# Patient Record
Sex: Female | Born: 1985 | Marital: Married | Smoking: Never smoker
Health system: Southern US, Community
[De-identification: ages and names within clinical notes are randomized; demographics above are authoritative.]

## PROBLEM LIST (undated history)

## (undated) DIAGNOSIS — O09299 Supervision of pregnancy with other poor reproductive or obstetric history, unspecified trimester: Secondary | ICD-10-CM

## (undated) DIAGNOSIS — T8859XA Other complications of anesthesia, initial encounter: Secondary | ICD-10-CM

## (undated) HISTORY — PX: FOOT SURGERY: SHX648

---

## 2021-09-18 LAB — OB RESULTS CONSOLE RUBELLA ANTIBODY, IGM: Rubella: IMMUNE

## 2021-09-18 LAB — OB RESULTS CONSOLE HIV ANTIBODY (ROUTINE TESTING): HIV: NONREACTIVE

## 2021-09-18 LAB — OB RESULTS CONSOLE RPR: RPR: NONREACTIVE

## 2021-09-18 LAB — OB RESULTS CONSOLE ABO/RH: RH Type: POSITIVE

## 2021-09-18 LAB — OB RESULTS CONSOLE GC/CHLAMYDIA
Chlamydia: NEGATIVE
Neisseria Gonorrhea: NEGATIVE

## 2021-09-18 LAB — OB RESULTS CONSOLE ANTIBODY SCREEN: Antibody Screen: NEGATIVE

## 2021-09-18 LAB — OB RESULTS CONSOLE HEPATITIS B SURFACE ANTIGEN: Hepatitis B Surface Ag: NEGATIVE

## 2021-09-19 ENCOUNTER — Inpatient Hospital Stay (HOSPITAL_COMMUNITY): Admission: AD | Admit: 2021-09-19 | Payer: 59 | Source: Home / Self Care | Admitting: Obstetrics and Gynecology

## 2022-02-11 LAB — OB RESULTS CONSOLE GBS: GBS: POSITIVE

## 2022-02-18 ENCOUNTER — Encounter (HOSPITAL_COMMUNITY): Payer: Self-pay | Admitting: *Deleted

## 2022-02-18 ENCOUNTER — Telehealth (HOSPITAL_COMMUNITY): Payer: Self-pay | Admitting: *Deleted

## 2022-02-18 NOTE — Telephone Encounter (Signed)
Preadmission screen  

## 2022-02-18 NOTE — Patient Instructions (Signed)
Mary Mccall  02/18/2022   Your procedure is scheduled on:  03/04/2022  Arrive at 1200 at Entrance C on [employer] at [facility]  and [employer]. You are invited to use the FREE valet parking or use the Visitor's parking deck.  Pick up the phone at the desk and dial [phone].  Call this number if you have problems the morning of surgery: [phone]  Remember:   Do not eat food:(After Midnight) Desps de medianoche.  Do not drink clear liquids: (After Midnight) Desps de medianoche.  Take these medicines the morning of surgery with A SIP OF WATER:  none   Do not wear jewelry, make-up or nail polish.  Do not wear lotions, powders, or perfumes. Do not wear deodorant.  Do not shave 48 hours prior to surgery.  Do not bring valuables to the hospital.  [facility] is not   responsible for any belongings or valuables brought to the hospital.  Contacts, dentures or bridgework may not be worn into surgery.  Leave suitcase in the car. After surgery it may be brought to your room.  For patients admitted to the hospital, checkout time is 11:00 AM the day of              discharge.      Please read over the following fact sheets that you were given:     Preparing for Surgery

## 2022-02-20 ENCOUNTER — Other Ambulatory Visit: Payer: Self-pay | Admitting: Obstetrics and Gynecology

## 2022-02-20 ENCOUNTER — Telehealth (HOSPITAL_COMMUNITY): Payer: Self-pay | Admitting: *Deleted

## 2022-02-20 ENCOUNTER — Encounter (HOSPITAL_COMMUNITY): Payer: Self-pay

## 2022-02-20 NOTE — Telephone Encounter (Signed)
Preadmission screen  

## 2022-03-03 ENCOUNTER — Encounter (HOSPITAL_COMMUNITY)
Admission: RE | Admit: 2022-03-03 | Discharge: 2022-03-03 | Disposition: A | Discharge status: Home / Self Care | Payer: 59 | Source: Ambulatory Visit | Attending: Obstetrics and Gynecology | Admitting: Obstetrics and Gynecology

## 2022-03-03 ENCOUNTER — Encounter (HOSPITAL_COMMUNITY): Payer: Self-pay | Admitting: Obstetrics and Gynecology

## 2022-03-03 DIAGNOSIS — Z01812 Encounter for preprocedural laboratory examination: Secondary | ICD-10-CM | POA: Insufficient documentation

## 2022-03-03 LAB — CBC
HCT: 30.8 % — ABNORMAL LOW (ref 36.0–46.0)
Hemoglobin: 10.2 g/dL — ABNORMAL LOW (ref 12.0–15.0)
MCH: 30.4 pg (ref 26.0–34.0)
MCHC: 33.1 g/dL (ref 30.0–36.0)
MCV: 91.7 fL (ref 80.0–100.0)
Platelets: 178 10*3/uL (ref 150–400)
RBC: 3.36 MIL/uL — ABNORMAL LOW (ref 3.87–5.11)
RDW: 13.5 % (ref 11.5–15.5)
WBC: 10 10*3/uL (ref 4.0–10.5)
nRBC: 0 % (ref 0.0–0.2)

## 2022-03-03 LAB — TYPE AND SCREEN
ABO/RH(D): A POS
Antibody Screen: NEGATIVE

## 2022-03-03 LAB — SYPHILIS: RPR W/REFLEX TO RPR TITER AND TREPONEMAL ANTIBODIES, TRADITIONAL SCREENING AND DIAGNOSIS ALGORITHM: RPR Ser Ql: NONREACTIVE

## 2022-03-04 ENCOUNTER — Inpatient Hospital Stay (HOSPITAL_COMMUNITY)
Admission: RE | Admit: 2022-03-04 | Discharge: 2022-03-06 | DRG: 787 | Disposition: A | Discharge status: Home / Self Care | Payer: 59 | Source: Ambulatory Visit | Attending: Obstetrics and Gynecology | Admitting: Obstetrics and Gynecology

## 2022-03-04 ENCOUNTER — Other Ambulatory Visit: Payer: Self-pay

## 2022-03-04 ENCOUNTER — Encounter (HOSPITAL_COMMUNITY): Payer: Self-pay | Admitting: Obstetrics and Gynecology

## 2022-03-04 ENCOUNTER — Inpatient Hospital Stay (HOSPITAL_COMMUNITY): Payer: 59 | Admitting: Anesthesiology

## 2022-03-04 ENCOUNTER — Encounter (HOSPITAL_COMMUNITY)
Admission: RE | Disposition: A | Discharge status: Home / Self Care | Payer: Self-pay | Source: Ambulatory Visit | Attending: Obstetrics and Gynecology

## 2022-03-04 DIAGNOSIS — Z98891 History of uterine scar from previous surgery: Secondary | ICD-10-CM

## 2022-03-04 DIAGNOSIS — O99824 Streptococcus B carrier state complicating childbirth: Secondary | ICD-10-CM | POA: Diagnosis present

## 2022-03-04 DIAGNOSIS — Z3A39 39 weeks gestation of pregnancy: Secondary | ICD-10-CM

## 2022-03-04 DIAGNOSIS — B951 Streptococcus, group B, as the cause of diseases classified elsewhere: Secondary | ICD-10-CM | POA: Diagnosis present

## 2022-03-04 DIAGNOSIS — O9902 Anemia complicating childbirth: Secondary | ICD-10-CM | POA: Diagnosis present

## 2022-03-04 DIAGNOSIS — O321XX0 Maternal care for breech presentation, not applicable or unspecified: Secondary | ICD-10-CM | POA: Diagnosis present

## 2022-03-04 DIAGNOSIS — O9832 Other infections with a predominantly sexual mode of transmission complicating childbirth: Secondary | ICD-10-CM | POA: Diagnosis present

## 2022-03-04 DIAGNOSIS — D509 Iron deficiency anemia, unspecified: Secondary | ICD-10-CM | POA: Diagnosis present

## 2022-03-04 DIAGNOSIS — A6000 Herpesviral infection of urogenital system, unspecified: Secondary | ICD-10-CM | POA: Diagnosis present

## 2022-03-04 DIAGNOSIS — O328XX0 Maternal care for other malpresentation of fetus, not applicable or unspecified: Secondary | ICD-10-CM | POA: Diagnosis present

## 2022-03-04 LAB — ABO/RH: ABO/RH(D): A POS

## 2022-03-04 SURGERY — Surgical Case
Anesthesia: Spinal

## 2022-03-04 MED ORDER — SENNOSIDES-DOCUSATE SODIUM 8.6-50 MG PO TABS
2.0000 | ORAL_TABLET | Freq: Every day | ORAL | Status: DC
  Administered 2022-03-05: 2 via ORAL
  Filled 2022-03-04 (×2): qty 2

## 2022-03-04 MED ORDER — HYDROMORPHONE HCL 1 MG/ML IJ SOLN: 0.2000 mg | INTRAMUSCULAR | Status: DC | PRN

## 2022-03-04 MED ORDER — SIMETHICONE 80 MG PO CHEW: 80.0000 mg | CHEWABLE_TABLET | ORAL | Status: DC | PRN

## 2022-03-04 MED ORDER — PHENYLEPHRINE HCL-NACL 20-0.9 MG/250ML-% IV SOLN
INTRAVENOUS | Status: DC | PRN
  Administered 2022-03-04: 60 ug/min via INTRAVENOUS

## 2022-03-04 MED ORDER — DIBUCAINE (PERIANAL) 1 % EX OINT
1.0000 | TOPICAL_OINTMENT | CUTANEOUS | Status: DC | PRN
Start: 2022-03-04 — End: 2022-03-06

## 2022-03-04 MED ORDER — METOCLOPRAMIDE HCL 5 MG/ML IJ SOLN
INTRAMUSCULAR | Status: AC
  Filled 2022-03-04: qty 2

## 2022-03-04 MED ORDER — OXYTOCIN-SODIUM CHLORIDE 30-0.9 UT/500ML-% IV SOLN
INTRAVENOUS | Status: DC | PRN
  Administered 2022-03-04: 400 mL via INTRAVENOUS

## 2022-03-04 MED ORDER — LACTATED RINGERS IV SOLN: INTRAVENOUS | Status: DC

## 2022-03-04 MED ORDER — KETOROLAC TROMETHAMINE 30 MG/ML IJ SOLN
30.0000 mg | Freq: Four times a day (QID) | INTRAMUSCULAR | Status: AC
  Administered 2022-03-04 – 2022-03-05 (×3): 30 mg via INTRAVENOUS
  Filled 2022-03-04 (×3): qty 1

## 2022-03-04 MED ORDER — MENTHOL 3 MG MT LOZG: 1.0000 | LOZENGE | OROMUCOSAL | Status: DC | PRN

## 2022-03-04 MED ORDER — FENTANYL CITRATE (PF) 100 MCG/2ML IJ SOLN
INTRAMUSCULAR | Status: AC
  Filled 2022-03-04: qty 2

## 2022-03-04 MED ORDER — OXYCODONE HCL 5 MG PO TABS: 5.0000 mg | ORAL_TABLET | ORAL | Status: DC | PRN

## 2022-03-04 MED ORDER — ONDANSETRON HCL 4 MG/2ML IJ SOLN
INTRAMUSCULAR | Status: AC
  Filled 2022-03-04: qty 2

## 2022-03-04 MED ORDER — PHENYLEPHRINE HCL-NACL 20-0.9 MG/250ML-% IV SOLN
INTRAVENOUS | Status: AC
  Filled 2022-03-04: qty 250

## 2022-03-04 MED ORDER — POVIDONE-IODINE 10 % EX SWAB
2.0000 "application " | Freq: Once | CUTANEOUS | Status: AC
  Administered 2022-03-04: 2 via TOPICAL

## 2022-03-04 MED ORDER — MORPHINE SULFATE (PF) 0.5 MG/ML IJ SOLN
INTRAMUSCULAR | Status: DC | PRN
  Administered 2022-03-04: 150 ug via INTRATHECAL

## 2022-03-04 MED ORDER — ZOLPIDEM TARTRATE 5 MG PO TABS: 5.0000 mg | ORAL_TABLET | Freq: Every evening | ORAL | Status: DC | PRN

## 2022-03-04 MED ORDER — FENTANYL CITRATE (PF) 100 MCG/2ML IJ SOLN
INTRAMUSCULAR | Status: DC | PRN
Start: 2022-03-04 — End: 2022-03-04
  Administered 2022-03-04: 15 ug via INTRATHECAL

## 2022-03-04 MED ORDER — ACETAMINOPHEN 500 MG PO TABS
1000.0000 mg | ORAL_TABLET | Freq: Four times a day (QID) | ORAL | Status: DC
  Administered 2022-03-04 – 2022-03-06 (×7): 1000 mg via ORAL
  Filled 2022-03-04 (×7): qty 2

## 2022-03-04 MED ORDER — WITCH HAZEL-GLYCERIN EX PADS: 1.0000 "application " | MEDICATED_PAD | CUTANEOUS | Status: DC | PRN

## 2022-03-04 MED ORDER — MORPHINE SULFATE (PF) 0.5 MG/ML IJ SOLN
INTRAMUSCULAR | Status: AC
  Filled 2022-03-04: qty 10

## 2022-03-04 MED ORDER — OXYTOCIN-SODIUM CHLORIDE 30-0.9 UT/500ML-% IV SOLN
2.5000 [IU]/h | INTRAVENOUS | Status: AC
  Administered 2022-03-04: 2.5 [IU]/h via INTRAVENOUS
  Filled 2022-03-04: qty 500

## 2022-03-04 MED ORDER — COCONUT OIL OIL: 1.0000 "application " | TOPICAL_OIL | Status: DC | PRN

## 2022-03-04 MED ORDER — PRENATAL MULTIVITAMIN CH
1.0000 | ORAL_TABLET | Freq: Every day | ORAL | Status: DC
  Administered 2022-03-05 – 2022-03-06 (×2): 1 via ORAL
  Filled 2022-03-04 (×2): qty 1

## 2022-03-04 MED ORDER — CEFAZOLIN SODIUM-DEXTROSE 2-4 GM/100ML-% IV SOLN
2.0000 g | INTRAVENOUS | Status: AC
  Administered 2022-03-04: 2 g via INTRAVENOUS

## 2022-03-04 MED ORDER — IBUPROFEN 600 MG PO TABS: 600.0000 mg | ORAL_TABLET | Freq: Four times a day (QID) | ORAL | Status: DC

## 2022-03-04 MED ORDER — SCOPOLAMINE 1 MG/3DAYS TD PT72
MEDICATED_PATCH | TRANSDERMAL | Status: AC
  Filled 2022-03-04: qty 1

## 2022-03-04 MED ORDER — PHENYLEPHRINE 80 MCG/ML (10ML) SYRINGE FOR IV PUSH (FOR BLOOD PRESSURE SUPPORT)
PREFILLED_SYRINGE | INTRAVENOUS | Status: AC
  Filled 2022-03-04: qty 10

## 2022-03-04 MED ORDER — TETANUS-DIPHTH-ACELL PERTUSSIS 5-2.5-18.5 LF-MCG/0.5 IM SUSY: 0.5000 mL | PREFILLED_SYRINGE | Freq: Once | INTRAMUSCULAR | Status: DC

## 2022-03-04 MED ORDER — SIMETHICONE 80 MG PO CHEW
80.0000 mg | CHEWABLE_TABLET | Freq: Three times a day (TID) | ORAL | Status: DC
  Administered 2022-03-05 – 2022-03-06 (×3): 80 mg via ORAL
  Filled 2022-03-04 (×3): qty 1

## 2022-03-04 MED ORDER — METOCLOPRAMIDE HCL 5 MG/ML IJ SOLN
INTRAMUSCULAR | Status: DC | PRN
  Administered 2022-03-04: 10 mg via INTRAVENOUS

## 2022-03-04 MED ORDER — DIPHENHYDRAMINE HCL 25 MG PO CAPS: 25.0000 mg | ORAL_CAPSULE | Freq: Four times a day (QID) | ORAL | Status: DC | PRN

## 2022-03-04 MED ORDER — DEXAMETHASONE SODIUM PHOSPHATE 4 MG/ML IJ SOLN
INTRAMUSCULAR | Status: DC | PRN
  Administered 2022-03-04: 8 mg via INTRAVENOUS

## 2022-03-04 MED ORDER — DEXAMETHASONE SODIUM PHOSPHATE 4 MG/ML IJ SOLN
INTRAMUSCULAR | Status: AC
  Filled 2022-03-04: qty 2

## 2022-03-04 MED ORDER — PHENYLEPHRINE HCL (PRESSORS) 10 MG/ML IV SOLN
INTRAVENOUS | Status: DC | PRN
  Administered 2022-03-04 (×2): 160 ug via INTRAVENOUS
  Administered 2022-03-04: 80 ug via INTRAVENOUS

## 2022-03-04 MED ORDER — IBUPROFEN 600 MG PO TABS
600.0000 mg | ORAL_TABLET | Freq: Four times a day (QID) | ORAL | Status: DC
  Administered 2022-03-05 – 2022-03-06 (×4): 600 mg via ORAL
  Filled 2022-03-04 (×4): qty 1

## 2022-03-04 MED ORDER — CEFAZOLIN SODIUM-DEXTROSE 2-4 GM/100ML-% IV SOLN
INTRAVENOUS | Status: AC
  Filled 2022-03-04: qty 100

## 2022-03-04 MED ORDER — OXYTOCIN-SODIUM CHLORIDE 30-0.9 UT/500ML-% IV SOLN
INTRAVENOUS | Status: AC
  Filled 2022-03-04: qty 500

## 2022-03-04 MED ORDER — BUPIVACAINE IN DEXTROSE 0.75-8.25 % IT SOLN
INTRATHECAL | Status: DC | PRN
  Administered 2022-03-04: 1.6 mL via INTRATHECAL

## 2022-03-04 MED ORDER — ALBUMIN HUMAN 5 % IV SOLN
INTRAVENOUS | Status: AC
  Filled 2022-03-04: qty 500

## 2022-03-04 MED ORDER — SCOPOLAMINE 1 MG/3DAYS TD PT72
MEDICATED_PATCH | TRANSDERMAL | Status: DC | PRN
  Administered 2022-03-04: 1 via TRANSDERMAL

## 2022-03-04 MED ORDER — ONDANSETRON HCL 4 MG/2ML IJ SOLN
INTRAMUSCULAR | Status: DC | PRN
  Administered 2022-03-04: 4 mg via INTRAVENOUS

## 2022-03-04 MED ORDER — KETOROLAC TROMETHAMINE 30 MG/ML IJ SOLN: 30.0000 mg | Freq: Four times a day (QID) | INTRAMUSCULAR | Status: DC

## 2022-03-04 MED ORDER — ALBUMIN HUMAN 5 % IV SOLN: INTRAVENOUS | Status: DC | PRN

## 2022-03-04 MED ORDER — ACETAMINOPHEN 500 MG PO TABS: 1000.0000 mg | ORAL_TABLET | Freq: Four times a day (QID) | ORAL | Status: DC

## 2022-03-04 SURGICAL SUPPLY — 33 items
APL SKNCLS STERI-STRIP NONHPOA (GAUZE/BANDAGES/DRESSINGS) ×1
BENZOIN TINCTURE PRP APPL 2/3 (GAUZE/BANDAGES/DRESSINGS) ×1 IMPLANT
CHLORAPREP W/TINT 26ML (MISCELLANEOUS) ×4 IMPLANT
CLAMP CORD UMBIL (MISCELLANEOUS) ×2 IMPLANT
CLOTH BEACON ORANGE TIMEOUT ST (SAFETY) ×2 IMPLANT
DRSG OPSITE POSTOP 4X10 (GAUZE/BANDAGES/DRESSINGS) ×2 IMPLANT
ELECT REM PT RETURN 9FT ADLT (ELECTROSURGICAL) ×2
ELECTRODE REM PT RTRN 9FT ADLT (ELECTROSURGICAL) ×1 IMPLANT
EXTRACTOR VACUUM KIWI (MISCELLANEOUS) IMPLANT
GLOVE BIOGEL PI IND STRL 7.0 (GLOVE) ×3 IMPLANT
GLOVE BIOGEL PI INDICATOR 7.0 (GLOVE) ×3
GLOVE ECLIPSE 6.5 STRL STRAW (GLOVE) ×2 IMPLANT
GOWN STRL REUS W/TWL LRG LVL3 (GOWN DISPOSABLE) ×4 IMPLANT
KIT ABG SYR 3ML LUER SLIP (SYRINGE) IMPLANT
LIGASURE IMPACT 36 18CM CVD LR (INSTRUMENTS) ×2 IMPLANT
NDL HYPO 25X5/8 SAFETYGLIDE (NEEDLE) IMPLANT
NEEDLE HYPO 25X5/8 SAFETYGLIDE (NEEDLE) IMPLANT
NS IRRIG 1000ML POUR BTL (IV SOLUTION) ×2 IMPLANT
PACK C SECTION WH (CUSTOM PROCEDURE TRAY) ×2 IMPLANT
PAD OB MATERNITY 4.3X12.25 (PERSONAL CARE ITEMS) ×2 IMPLANT
STRIP CLOSURE SKIN 1/2X4 (GAUZE/BANDAGES/DRESSINGS) IMPLANT
STRIP SURGICAL 1/4 X 6 IN (GAUZE/BANDAGES/DRESSINGS) ×1 IMPLANT
SUT MNCRL 0 VIOLET CTX 36 (SUTURE) ×2 IMPLANT
SUT MONOCRYL 0 CTX 36 (SUTURE) ×2
SUT PLAIN 0 NONE (SUTURE) IMPLANT
SUT PLAIN 2 0 (SUTURE) ×2
SUT PLAIN ABS 2-0 CT1 27XMFL (SUTURE) ×1 IMPLANT
SUT VIC AB 0 CT1 27 (SUTURE) ×2
SUT VIC AB 0 CT1 27XBRD ANBCTR (SUTURE) ×1 IMPLANT
SUT VIC AB 4-0 KS 27 (SUTURE) ×2 IMPLANT
TOWEL OR 17X24 6PK STRL BLUE (TOWEL DISPOSABLE) ×2 IMPLANT
TRAY FOLEY W/BAG SLVR 14FR LF (SET/KITS/TRAYS/PACK) IMPLANT
WATER STERILE IRR 1000ML POUR (IV SOLUTION) ×2 IMPLANT

## 2022-03-04 NOTE — Anesthesia Postprocedure Evaluation (Signed)
Anesthesia Post Note  Patient: Mary Mccall  Procedure(s) Performed: Primary CESAREAN SECTION     Patient location during evaluation: PACU Anesthesia Type: Spinal Level of consciousness: awake and alert Pain management: pain level controlled Vital Signs Assessment: post-procedure vital signs reviewed and stable Respiratory status: spontaneous breathing, nonlabored ventilation and respiratory function stable Cardiovascular status: blood pressure returned to baseline and stable Postop Assessment: no apparent nausea or vomiting Anesthetic complications: no   No notable events documented.  Last Vitals:  Vitals:   03/04/22 1600 03/04/22 1615  BP: 103/68 104/69  Pulse: 65 61  Resp: 13 11  Temp: (!) 35.3 C (!) 35.4 C  SpO2: 92% 90%    Last Pain:  Vitals:   03/04/22 1600  TempSrc: Temporal   Pain Goal:                   Mary Mccall

## 2022-03-04 NOTE — Lactation Note (Signed)
This note was copied from a baby's chart. Lactation Consultation Note Baby on the breast when LC came into rm. When baby came off breast nipple slanted and pinched. Mom denied painful latch. No positional stripes at this time. Mom had baby in cradle position unable to control baby's head for positioning. Re-positioned in football hold. Baby fell asleep at breast. Newborn feeding habits, behavior, STS, I&O, support, supply and demand reviewed. Mom encouraged to feed baby 8-12 times/24 hours and with feeding cues.   Encouraged mom to call for assistance as needed.  Patient Name: Mary Mccall [id number] Date: 03/04/2022 Reason for consult: Initial assessment;Primapara;Term Age:41 hours  Maternal Data Has patient been taught Hand Expression?: Yes Does the patient have breastfeeding experience prior to this delivery?: No  Feeding    LATCH Score Latch: Grasps breast easily, tongue down, lips flanged, rhythmical sucking.  Audible Swallowing: None  Type of Nipple: Everted at rest and after stimulation  Comfort (Breast/Nipple): Soft / non-tender  Hold (Positioning): Assistance needed to correctly position infant at breast and maintain latch.  LATCH Score: 7   Lactation Tools Discussed/Used    Interventions Interventions: Breast feeding basics reviewed;Assisted with latch;Skin to skin;Breast massage;Hand express;Breast compression;Adjust position;Support pillows;Position options;LC Services brochure  Discharge    Consult Status Consult Status: Follow-up Date: 03/05/22 Follow-up type: In-patient    Charyl Dancer 03/04/2022, 9:27 PM

## 2022-03-04 NOTE — Anesthesia Preprocedure Evaluation (Signed)
Anesthesia Evaluation  Patient identified by MRN, date of birth, ID band Patient awake    Reviewed: Allergy & Precautions, NPO status , Patient's Chart, lab work & pertinent test results  Airway Mallampati: II  TM Distance: >3 FB Neck ROM: Full    Dental no notable dental hx.    Pulmonary neg pulmonary ROS,    Pulmonary exam normal breath sounds clear to auscultation       Cardiovascular negative cardio ROS Normal cardiovascular exam Rhythm:Regular Rate:Normal     Neuro/Psych negative neurological ROS  negative psych ROS   GI/Hepatic negative GI ROS, Neg liver ROS,   Endo/Other  negative endocrine ROS  Renal/GU negative Renal ROS  negative genitourinary   Musculoskeletal negative musculoskeletal ROS (+)   Abdominal   Peds negative pediatric ROS (+)  Hematology negative hematology ROS (+)   Anesthesia Other Findings   Reproductive/Obstetrics (+) Pregnancy                             Anesthesia Physical Anesthesia Plan  ASA: 2  Anesthesia Plan: Spinal   Post-op Pain Management: Regional block* and Dilaudid IV   Induction:   PONV Risk Score and Plan: 2 and Ondansetron, Midazolam and Treatment may vary due to age or medical condition  Airway Management Planned: Natural Airway  Additional Equipment:   Intra-op Plan:   Post-operative Plan:   Informed Consent:   Plan Discussed with:   Anesthesia Plan Comments:         Anesthesia Quick Evaluation

## 2022-03-04 NOTE — Op Note (Signed)
Mary Mccall 1986-09-11 [id number]  OPERATIVE NOTE  PROCEDURE: primary low transverse cesarean section  PRE-OPERATIVE DIAGNOSIS:  Single intrauterine pregnancy at 39 weeks 2 days Persistent breech presentation  POST-OPERATIVE DIAGNOSIS: 1.   Single intrauterine pregnancy at 39 weeks 2 days 2.   Persistent breech presentation  SURGEON: Clance Boll, DO  ASSISTANT: Dorisann Frames, CNM  FINDINGS: normal female gravid anatomy including uterus with bilateral fallopian tubes and ovaries. Single viable healthy female infant in the double footling breech presentation with apgars of 8 and 9 at the 1 and 5 minutes respectively and weight of 8 pounds 4 ounces (3750 grams)   EBL: 936 cc  FLUIDS:  3,000 cc LR  MEDICATIONS: Ancef 2 g   URINE OUTPUT: 200 cc  COMPLICATIONS: None  PROCEDURE IN DETAIL:  After the patient was appropriately consented she was taken to the operating room where regional anesthesia was obtained without complications. The patient was placed in the dorsal supine position with leftward tilt. Fetal heart tones were obtained and found to be reassuring. A Foley catheter was placed and the bladder was drained for clear yellow urine and remained in place for the duration of the procedure. The patient was prepped and draped in the usual sterile fashion. An appropriate time out was performed that verified the correct patient, procedure, and surgical team.   The scalpel was used to make a low transverse skin incision. The incision was carried down to the fascia, maintaining hemostasis with the Bovie as needed, The fascia was incised to the left and the right of the midline. The fascia incision was carried laterally using the curved Mayo scissors on either side. The inferior aspect of the fascia was grasped with the Kocher clamps, tented upwards, and the underlying rectus muscle dissected off bluntly and sharply with curved Mayo scissors. The Kocher clamps were removed, placed on  the superior aspect of the fascia and the rectus muscles dissected off in a similar fashion. The rectus muscles were divided at the midline bluntly. The peritoneum was identified, grasped with hemostat clamps and entered sharply with the Metzenbaum scissors. The incision was then extended laterally by bluntly stretching. The bladder blade retractor was introduced. The vesicouterine peritoneum was dissected off the lower uterine segment and a bladder flap was created digitally. The scalpel was used to make a low transverse uterine incision. The incision was extended caudad and cephalad by bluntly stretching. The amniotic membranes were ruptured for clear fluid. The infant was found to be in the double footling breech presentation. The infant was delivered in the usual breech fashion. After 60 seconds delayed cord clamping observed, the umbilical cord was double clamped and cut. The infant handed off to the awaiting neonatal team. The placenta was delivered intact by manual extraction. The uterine cavity was cleared of any clot and debris. The hysterotomy was closed using 0-Monocryl in a running locking fashion. A second layer closure was performed using the same suture. Adequate hemostasis of the hysterotomy was noted. The bilateral gutters were cleared of all clot and debris. Bilateral tubes and ovaries were inspected and found to be within normal limits. The underlying fascia planes were inspected and found to be hemostatic. The fascia was closed with 0-Vicryl a normal running fashion. The subcutaneous layer was irrigated with sterile saline and hemostasis obtained with the Bovie. The subcutaneous layer was closed with 2-0 plain. The skin layer was closed with 4-0 Vicryl. The patient tolerated the procedure well and was taken to the recovery room in  stable condition. All instrument, needle, and sponge counts were correct.   Mary Mccall A Mary Mccall 03/04/22 3:26 PM

## 2022-03-04 NOTE — Anesthesia Procedure Notes (Signed)
Spinal  Patient location during procedure: OB Start time: 03/04/2022 1:38 PM End time: 03/04/2022 1:43 PM Reason for block: surgical anesthesia Staffing Performed: anesthesiologist  Anesthesiologist: Lowella Curb, MD Preanesthetic Checklist Completed: patient identified, IV checked, risks and benefits discussed, surgical consent, monitors and equipment checked, pre-op evaluation and timeout performed Spinal Block Patient position: sitting Prep: DuraPrep and site prepped and draped Patient monitoring: heart rate, cardiac monitor, continuous pulse ox and blood pressure Approach: midline Location: L3-4 Injection technique: single-shot Needle Needle type: Pencan  Needle gauge: 24 G Needle length: 10 cm Assessment Sensory level: T4 Events: CSF return

## 2022-03-04 NOTE — Progress Notes (Signed)
Rewrapped with bear blanket and warm blankets. Will re-check temperature in fifteen minutes.

## 2022-03-04 NOTE — H&P (Signed)
Mary Mccall is a 36 y.o. female G1P0 [redacted]w[redacted]d presenting for scheduled cesarean section for breech presentation.   This is an IVF pregnancy dated by ART date, single FET. 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 She did have Panorama NIPS done with previous OB in [facility], low risk results, before continuing care at [facility]. Routine prenatal labs done WNL. AFP1 screen negative and normal fetal anatomy scan. Growth US performed at 34 weeks identified Breech presentation with an EFW of  5#11 (73%) and anterior/fundal placenta noted. Follow up US for position confirmation done at 36 weeks revealed persistent breech presentation. Patient was counseled and offered ECV which she declined and decided to proceed with scheduled primary cesarean section. Third trimester labs WNL, 1hr GTT 122 and Hgb 11.3. 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 No previous abdominal surgeries.  Patient has husband Mary Mccall present for support. They are expecting a surprise gender baby with name TBD.  OB History     Gravida  1   Para      Term      Preterm      AB      Living         SAB      IAB      Ectopic      Multiple      Live Births             History reviewed. No pertinent past medical history. Past Surgical History:  Procedure Laterality Date   FOOT SURGERY     Family History: family history includes Stroke in her father. Social History:  reports that she has never smoked. She has never used smokeless tobacco. She reports that she does not drink alcohol and does not use drugs.     Maternal Diabetes: No Genetic Screening: Normal Maternal Ultrasounds/Referrals: Normal Fetal Ultrasounds or other Referrals:  None Maternal Substance Abuse:  No Significant Maternal Medications:  None Significant Maternal Lab Results:  Group B Strep positive Other Comments:  None  Review of Systems  All other systems  reviewed and are negative. Per HPI Maternal Exam:  Uterine Assessment: Contraction frequency is rare.  Abdomen: Estimated fetal weight is 7#12.   Fetal presentation: breech   Fetal Exam Fetal State Assessment: Category I - tracings are normal.  Physical Exam Vitals reviewed.  Constitutional:      Appearance: Normal appearance.  HENT:     Head: Normocephalic.  Cardiovascular:     Rate and Rhythm: Normal rate.  Pulmonary:     Effort: Pulmonary effort is normal.  Abdominal:     Tenderness: There is no abdominal tenderness.  Musculoskeletal:        General: Normal range of motion.     Cervical back: Normal range of motion.  Skin:    General: Skin is warm and dry.  Neurological:     General: No focal deficit present.     Mental Status: She is alert and oriented to person, place, and time.  Psychiatric:        Mood and Affect: Mood normal.        Behavior: Behavior normal.      Blood pressure 121/82, pulse 88, temperature 98.8 F (37.1 C), resp. rate 16, height 5\' 5"  (1.651 m), weight 87.1 kg, SpO2 99 %.  Prenatal labs: ABO, Rh:  --/--/PENDING (06/06 1243) Antibody: NEG (06/05 1041) Rubella: Immune (12/21 0000) RPR: NON REACTIVE (  06/05 1042)  HBsAg: Negative (12/21 0000)  HIV: Non-reactive (12/21 0000)  GBS: Positive/-- (05/16 0000)   ChemistryNo results for input(s): NA, K, CL, CO2, GLUCOSE, BUN, CREATININE, CALCIUM, GFRNONAA, GFRAA, ANIONGAP in the last 168 hours.  No results for input(s): PROT, ALBUMIN, AST, ALT, ALKPHOS, BILITOT in the last 168 hours. Hematology Recent Labs  Lab 03/03/22 1042  WBC 10.0  RBC 3.36*  HGB 10.2*  HCT 30.8*  MCV 91.7  MCH 30.4  MCHC 33.1  RDW 13.5  PLT 178   Cardiac EnzymesNo results for input(s): TROPONINI in the last 168 hours. No results for input(s): TROPIPOC in the last 168 hours.  BNPNo results for input(s): BNP, PROBNP in the last 168 hours.  DDimer No results for input(s): DDIMER in the last 168  hours.   Assessment/Plan: Mary Mccall is a 36 y.o. female G1P0 [redacted]w[redacted]d admitted for scheduled primary cesarean section for persistent breech presentation  Bedside US performed in preop again confirmed breech presentation. Patient has been thoroughly counseled on risks and benefits of ECV versus scheduled primary cesarean section. She has declined ECV and elects to proceed with primary cesarean section as scheduled. Thoroughly counseled in the office and again in the preop holding area on risks of cesarean section including but not limited to bleeding, infection, damage to surrounding organs, risks to future pregnancies including increased risk for repeat cesarean section, and inherit risks of anesthesia. She is agreeable to blood transfusion if clinically indicated. Consents signed at the bedside.  -Admit to OR -Routine admission labs -Anceg 2g IV abx ppx -SCD VTE ppx -Chronic IDA starting Hgb 10.2, plan to repeat postop  -GBS POS -RH POS, Rubella Imm -Routine intraop/postop care  Mary Cappella A Oluwadara Mccall 03/04/2022, 1:27 PM

## 2022-03-04 NOTE — Progress Notes (Signed)
Spoke to Dr. Hyacinth Meeker to report low O2 sats and low temperature. Dr. Hyacinth Meeker at bedside, no new orders given. May transfer to mother baby at this time.

## 2022-03-04 NOTE — Transfer of Care (Signed)
Immediate Anesthesia Transfer of Care Note  Patient: Mary Mccall  Procedure(s) Performed: Primary CESAREAN SECTION  Patient Location: PACU  Anesthesia Type:Spinal  Level of Consciousness: awake, alert  and oriented  Airway & Oxygen Therapy: Patient Spontanous Breathing  Post-op Assessment: Report given to RN and Post -op Vital signs reviewed and stable  Post vital signs: Reviewed and stable  Last Vitals:  Vitals Value Taken Time  BP 105/70 03/04/22 1531  Temp    Pulse 68 03/04/22 1539  Resp 19 03/04/22 1539  SpO2 86 % 03/04/22 1539  Vitals shown include unvalidated device data.  Last Pain: There were no vitals filed for this visit.       Complications: No notable events documented.

## 2022-03-05 DIAGNOSIS — O321XX0 Maternal care for breech presentation, not applicable or unspecified: Secondary | ICD-10-CM | POA: Diagnosis present

## 2022-03-05 LAB — CBC
HCT: 26.9 % — ABNORMAL LOW (ref 36.0–46.0)
Hemoglobin: 9 g/dL — ABNORMAL LOW (ref 12.0–15.0)
MCH: 30.5 pg (ref 26.0–34.0)
MCHC: 33.5 g/dL (ref 30.0–36.0)
MCV: 91.2 fL (ref 80.0–100.0)
Platelets: 148 10*3/uL — ABNORMAL LOW (ref 150–400)
RBC: 2.95 MIL/uL — ABNORMAL LOW (ref 3.87–5.11)
RDW: 13.7 % (ref 11.5–15.5)
WBC: 18.8 10*3/uL — ABNORMAL HIGH (ref 4.0–10.5)
nRBC: 0 % (ref 0.0–0.2)

## 2022-03-05 MED ORDER — POLYSACCHARIDE IRON COMPLEX 150 MG PO CAPS
150.0000 mg | ORAL_CAPSULE | Freq: Every day | ORAL | Status: DC
  Administered 2022-03-05: 150 mg via ORAL
  Filled 2022-03-05 (×2): qty 1

## 2022-03-05 MED ORDER — ONDANSETRON HCL 4 MG/2ML IJ SOLN: 4.0000 mg | Freq: Four times a day (QID) | INTRAMUSCULAR | Status: DC | PRN

## 2022-03-05 MED ORDER — MAGNESIUM OXIDE -MG SUPPLEMENT 400 (240 MG) MG PO TABS
400.0000 mg | ORAL_TABLET | Freq: Every day | ORAL | Status: DC
  Administered 2022-03-05 – 2022-03-06 (×2): 400 mg via ORAL
  Filled 2022-03-05 (×2): qty 1

## 2022-03-05 NOTE — Progress Notes (Signed)
   Subjective: POD# 1 Live born female  Birth Weight: 8 lb 4.3 oz (3750 g) APGAR: 8, 9  Newborn Delivery   Birth date/time: 03/04/2022 14:24:00 Delivery type: C-Section, Low Transverse Trial of labor: No C-section categorization: Primary     Baby name: Mary Mccall Delivering provider: LAW, CASSANDRA A   Feeding: breast  Pain control at delivery: Spinal   Reports feeling well.  Patient reports tolerating PO.   Breast symptoms:working on latch Pain controlled with acetaminophen and Toradol Denies HA/SOB/C/P/N/V/dizziness. Flatus absent. She reports vaginal bleeding as normal, without clots.  Not ambulating yet, has stood in room, foley cath still in place.   Objective:   VS:    Vitals:   03/04/22 2146 03/04/22 2330 03/05/22 0330 03/05/22 0746  BP: (!) 123/56 118/71 102/60 109/67  Pulse: 72 68 73 86  Resp: 18 18 16 16   Temp: (!) 97.3 F (36.3 C) (!) 97.5 F (36.4 C) 97.6 F (36.4 C) 97.7 F (36.5 C)  TempSrc: Oral Oral Oral Oral  SpO2: 95% 97% 94% 97%  Weight:      Height:         Intake/Output Summary (Last 24 hours) at 03/05/2022 1050 Last data filed at 03/05/2022 0746 Gross per 24 hour  Intake 4321.79 ml  Output 1786 ml  Net 2535.79 ml        Recent Labs    03/03/22 1042 03/05/22 0649  WBC 10.0 18.8*  HGB 10.2* 9.0*  HCT 30.8* 26.9*  PLT 178 148*     Blood type: --/--/A POS Performed at [facility], [address] [place], [place]  [phone]/06 1243)  Rubella: Immune (12/21 0000)  Vaccines: TDaP          UTD    Physical Exam:  General: alert, cooperative, and no distress CV: Regular rate and rhythm Resp: clear Abdomen: soft, nontender, normal bowel sounds, mild gas distention Incision: clean, dry, intact, and honeycomb dressing Uterine Fundus: firm, below umbilicus, nontender Lochia: minimal Ext: no edema, redness or tenderness in the calves or thighs  Assessment/Plan: 36 y.o.   POD# 1. G1P1001                  Principal Problem:    Postpartum care following cesarean delivery 6/6 Active Problems:   Status post primary low transverse cesarean section - 6/6   Iron deficiency anemia  - asymptomatic  - start oral Fe and Mag Ox   Breech presentation   Doing well, stable.               Advance diet as tolerated Encourage rest when baby rests Breastfeeding support Encourage to ambulate Routine post-op care, encouraged OOB and foley cath dc'ed today  31, CNM, MSN 03/05/2022, 10:50 AM

## 2022-03-05 NOTE — Lactation Note (Addendum)
This note was copied from a baby's chart. Lactation Consultation Note  Patient Name: Mary Mccall [id number] Date: 03/05/2022 Reason for consult: Follow-up assessment;1st time breastfeeding;Term;Infant weight loss (-5% weight loss, infant is currently cluster feeding.) Age:36 hours, mom requested for LC. Dad came to front desk requesting [place] due infant started cuing to breastfeed.  P1, term female infant with -5% weight loss. Per mom, infant doesn't latch well on her right breast, she would like assistance with latching infant. Mom latched infant on her right breast using the football hold, infant was not sustaining latch, on and off breast. LC suggested mom place hand on infant's neck and back instead of the middle of infant's head, LC mention to mom placing hand on infant's head,  might be why infant is coming off the  breast. After few minutes, LC suggested  mom latch infant on her left breast using the cradle hold, infant latched with depth few minutes and sustaining latch was not coming off breast, but  mom prefers the football position and switch  infant back to the position she is most comfortable with. Infant was fussy and on and off breast when LC left room, infant breastfeeding maybe 7 minutes, mom was still feeding infant when [facility] left the room. LC gave parents  [place] booklet  with different latch positions with infant position, hand placement and support pillows. Mom will continue to breastfeed infant according to hunger cues, on demand, skin to skin. LC suggested if infant very fussy to hand express and give infant a few drops to calm infant before latching infant at the breast.    Maternal Data    Feeding Mother's Current Feeding Choice: Breast Milk  LATCH Score Latch: Repeated attempts needed to sustain latch, nipple held in mouth throughout feeding, stimulation needed to elicit sucking reflex.  Audible Swallowing: A few with stimulation  Type of Nipple: Everted at rest  and after stimulation  Comfort (Breast/Nipple): Soft / non-tender  Hold (Positioning): Assistance needed to correctly position infant at breast and maintain latch.  LATCH Score: 7   Lactation Tools Discussed/Used    Interventions Interventions: Skin to skin;Assisted with latch;Adjust position;Support pillows;Position options;Breast compression;Education  Discharge    Consult Status Consult Status: Follow-up Date: 03/06/22 Follow-up type: In-patient    Vicente Serene 03/05/2022, 8:54 PM

## 2022-03-05 NOTE — Lactation Note (Addendum)
This note was copied from a baby's chart. Lactation Consultation Note  Patient Name: Mary Mccall [id number] Date: 03/05/2022   Age:36 hours LC gently knock before entering room asking permission from mom.  LC entered the room, mom was awake, per mom,  she  would like to rest at this time and she will call [facility] services when she is ready to breastfeed infant.   Maternal Data    Feeding    LATCH Score                    Lactation Tools Discussed/Used    Interventions    Discharge    Consult Status      Danelle Earthly 03/05/2022, 8:21 PM

## 2022-03-06 MED ORDER — IBUPROFEN 600 MG PO TABS: 600.0000 mg | ORAL_TABLET | Freq: Four times a day (QID) | ORAL | 0 refills | Status: DC

## 2022-03-06 MED ORDER — COCONUT OIL OIL: 1.0000 "application " | TOPICAL_OIL | 0 refills | Status: DC | PRN

## 2022-03-06 MED ORDER — OXYCODONE HCL 5 MG PO TABS: 5.0000 mg | ORAL_TABLET | Freq: Four times a day (QID) | ORAL | 0 refills | Status: AC | PRN

## 2022-03-06 MED ORDER — MAGNESIUM OXIDE -MG SUPPLEMENT 400 (240 MG) MG PO TABS: 400.0000 mg | ORAL_TABLET | Freq: Every day | ORAL | Status: DC

## 2022-03-06 MED ORDER — ACETAMINOPHEN 500 MG PO TABS: 1000.0000 mg | ORAL_TABLET | Freq: Four times a day (QID) | ORAL | 0 refills | Status: DC

## 2022-03-06 MED ORDER — SENNOSIDES-DOCUSATE SODIUM 8.6-50 MG PO TABS: 2.0000 | ORAL_TABLET | Freq: Every day | ORAL | Status: DC

## 2022-03-06 MED ORDER — POLYSACCHARIDE IRON COMPLEX 150 MG PO CAPS: 150.0000 mg | ORAL_CAPSULE | Freq: Every day | ORAL | Status: DC

## 2022-03-06 NOTE — Lactation Note (Signed)
This note was copied from a baby's chart. Lactation Consultation Note  Patient Name: Mary Mccall [id number] Date: 03/06/2022 Reason for consult: Follow-up assessment Age:36 hours  Weight loss stabilizing.  Mother denies questions or concerns. LC reviewed that if weight loss continues once home, mother can post pump and give volume back to baby. Reviewed engorgement care and monitoring voids/stools.  Feeding Mother's Current Feeding Choice: Breast Milk  Interventions Interventions: Education  Discharge Discharge Education: Engorgement and breast care;Warning signs for feeding baby  Consult Status Consult Status: Complete Date: 03/06/22    Dahlia Byes [facility] 03/06/2022, 10:16 AM

## 2022-03-06 NOTE — Discharge Summary (Signed)
OB Discharge Summary  Patient Name: Mary Mccall DOB: Nov 09, 1985 MRN: [id number]  Date of admission: 03/04/2022 Delivering provider: Clance Boll A   Admitting diagnosis: Postpartum care following cesarean delivery [Z39.2] Breech presentation [O32.1XX0] Intrauterine pregnancy: [redacted]w[redacted]d     Secondary diagnosis: Patient Active Problem List   Diagnosis Date Noted   Breech presentation 03/05/2022   Status post primary low transverse cesarean section - 6/6 03/04/2022   Postpartum care following cesarean delivery 6/6 03/04/2022   Iron deficiency anemia 03/04/2022   Positive GBS test 03/04/2022   Additional problems:none   Date of discharge: 03/06/2022   Discharge diagnosis: Principal Problem:   Postpartum care following cesarean delivery 6/6 Active Problems:   Status post primary low transverse cesarean section - 6/6   Iron deficiency anemia   Positive GBS test   Breech presentation                                                              Post partum procedures: none  Augmentation: N/A Pain control: Spinal  Laceration:  Episiotomy:  Complications: None  Hospital course:  Sceduled C/S   36 y.o. yo G1P1001 at [redacted]w[redacted]d was admitted to the hospital 03/04/2022 for scheduled cesarean section with the following indication:Malpresentation.Delivery details are as follows:  Membrane Rupture Time/Date: 2:23 PM ,03/04/2022   Delivery Method:C-Section, Low Transverse  Details of operation can be found in separate operative note.  Patient had an uncomplicated postpartum course.  She is ambulating, tolerating a regular diet, passing flatus, and urinating well. Patient is discharged home in stable condition on  03/06/22        Newborn Data: Birth date:03/04/2022  Birth time:2:24 PM  Gender:Female  Living status:Living  Apgars:8 ,9  Weight:3750 g     Physical exam  Vitals:   03/05/22 1130 03/05/22 1510 03/05/22 2149 03/06/22 0544  BP: 118/69 117/82 115/76 120/75  Pulse: 78 73 78 83   Resp: 18 16 17    Temp: 98 F (36.7 C) 97.8 F (36.6 C) 98 F (36.7 C)   TempSrc: Oral Oral Oral   SpO2:      Weight:      Height:       General: alert, cooperative, and no distress Lochia: appropriate Uterine Fundus: firm Incision: Healing well with no significant drainage, Dressing is clean, dry, and intact DVT Evaluation: No cords or calf tenderness. Calf/Ankle edema is present Labs: Lab Results  Component Value Date   WBC 18.8 (H) 03/05/2022   HGB 9.0 (L) 03/05/2022   HCT 26.9 (L) 03/05/2022   MCV 91.2 03/05/2022   PLT 148 (L) 03/05/2022       No data to display             No data to display         Vaccines: TDaP          UTD  Discharge instruction:  per After Visit Summary,  [facility] booklet and  "Understanding Mother [facility] Baby [facility]" hospital booklet  After Visit Meds:  Allergies as of 03/06/2022   No Known Allergies      Medication List     STOP taking these medications    valACYclovir 500 MG tablet Commonly known as: VALTREX       TAKE these medications  acetaminophen 500 MG tablet Commonly known as: TYLENOL Take 2 tablets (1,000 mg total) by mouth every 6 (six) hours.   coconut oil Oil Apply 1 application. topically as needed.   ibuprofen 600 MG tablet Commonly known as: ADVIL Take 1 tablet (600 mg total) by mouth every 6 (six) hours.   iron polysaccharides 150 MG capsule Commonly known as: Ferrex 150 Take 1 capsule (150 mg total) by mouth daily.   magnesium oxide 400 (240 Mg) MG tablet Commonly known as: MAG-OX Take 1 tablet (400 mg total) by mouth daily. For prevention of constipation.   oxyCODONE 5 MG immediate release tablet Commonly known as: Oxy IR/ROXICODONE Take 1 tablet (5 mg total) by mouth every 6 (six) hours as needed for up to 5 days for moderate pain.   prenatal multivitamin Tabs tablet Take 1 tablet by mouth daily at 12 noon.   senna-docusate 8.6-50 MG tablet Commonly known as: Senokot-S Take 2  tablets by mouth daily. Start taking on: March 07, 2022        Diet: iron rich diet  Activity: Advance as tolerated. Pelvic rest for 6 weeks.   Postpartum contraception: TBA in office  Newborn Data: Live born female  Birth Weight: 8 lb 4.3 oz (3750 g) APGAR: 8, 9  Newborn Delivery   Birth date/time: 03/04/2022 14:24:00 Delivery type: C-Section, Low Transverse Trial of labor: No C-section categorization: Primary      named May Baby Feeding: Breast Disposition:home with mother  Delivery Report:  Review the Delivery Report for details.    Follow up:  Follow-up Information     Law, Cassandra A, DO. Schedule an appointment as soon as possible for a visit in 6 week(s).   Specialty: Obstetrics and Gynecology Why: For Postpartum follow-up Contact information: [address] [place] [address] [phone]                   Signed: Neta Mends, CNM, MSN 03/06/2022, 11:21 AM

## 2022-03-06 NOTE — Discharge Instructions (Signed)
Lactation outpatient support - home visit ° ° °Jessica Bowers, IBCLC (lactation consultant)  & Birth Doula ° °Phone (text or call): [phone] °Email: [email or web address] °[email or web address] ° ° °Linda Coppola °RN, MHA, IBCLC °at [facility]: Lactation Consultant ° °[email or web address]/ °Mail: [email or web address] °Tel: [phone] ° ° °Additional breastfeeding resources: ° °[facility] °https://ibconline.ca/information-sheets/ ° °[employer] [facility] [place] ° °www.lllofnc.org ° ° °Other Resources: ° °Chiropractic specialist  ° °Dr. Leanna Hastings °[email or web address] ° ° °Craniosacral therapy for baby ° °Erin Balkind  °https://[email or web address]/ ° °

## 2022-03-08 ENCOUNTER — Other Ambulatory Visit: Payer: Self-pay

## 2022-03-08 ENCOUNTER — Inpatient Hospital Stay (HOSPITAL_COMMUNITY): Payer: 59

## 2022-03-08 ENCOUNTER — Observation Stay (HOSPITAL_COMMUNITY)
Admission: AD | Admit: 2022-03-08 | Discharge: 2022-03-10 | Disposition: A | Discharge status: Home / Self Care | Payer: 59 | Attending: Obstetrics and Gynecology | Admitting: Obstetrics and Gynecology

## 2022-03-08 ENCOUNTER — Encounter (HOSPITAL_COMMUNITY): Payer: Self-pay | Admitting: Obstetrics and Gynecology

## 2022-03-08 DIAGNOSIS — O9089 Other complications of the puerperium, not elsewhere classified: Secondary | ICD-10-CM | POA: Diagnosis present

## 2022-03-08 DIAGNOSIS — Z79899 Other long term (current) drug therapy: Secondary | ICD-10-CM | POA: Insufficient documentation

## 2022-03-08 DIAGNOSIS — O1495 Unspecified pre-eclampsia, complicating the puerperium: Principal | ICD-10-CM | POA: Diagnosis present

## 2022-03-08 DIAGNOSIS — J811 Chronic pulmonary edema: Secondary | ICD-10-CM | POA: Insufficient documentation

## 2022-03-08 DIAGNOSIS — I3139 Other pericardial effusion (noninflammatory): Secondary | ICD-10-CM | POA: Insufficient documentation

## 2022-03-08 DIAGNOSIS — O9943 Diseases of the circulatory system complicating the puerperium: Secondary | ICD-10-CM | POA: Insufficient documentation

## 2022-03-08 DIAGNOSIS — O9953 Diseases of the respiratory system complicating the puerperium: Secondary | ICD-10-CM | POA: Insufficient documentation

## 2022-03-08 LAB — COMPREHENSIVE METABOLIC PANEL
ALT: 107 U/L — ABNORMAL HIGH (ref 0–44)
AST: 125 U/L — ABNORMAL HIGH (ref 15–41)
Albumin: 2.7 g/dL — ABNORMAL LOW (ref 3.5–5.0)
Alkaline Phosphatase: 143 U/L — ABNORMAL HIGH (ref 38–126)
Anion gap: 9 (ref 5–15)
BUN: 15 mg/dL (ref 6–20)
CO2: 20 mmol/L — ABNORMAL LOW (ref 22–32)
Calcium: 8.2 mg/dL — ABNORMAL LOW (ref 8.9–10.3)
Chloride: 109 mmol/L (ref 98–111)
Creatinine, Ser: 0.8 mg/dL (ref 0.44–1.00)
GFR, Estimated: >60 mL/min (ref >60)
Glucose, Bld: 93 mg/dL (ref 70–99)
Potassium: 4.4 mmol/L (ref 3.5–5.1)
Sodium: 138 mmol/L (ref 135–145)
Total Bilirubin: 0.6 mg/dL (ref 0.3–1.2)
Total Protein: 5.2 g/dL — ABNORMAL LOW (ref 6.5–8.1)

## 2022-03-08 LAB — CBC
HCT: 28.4 % — ABNORMAL LOW (ref 36.0–46.0)
Hemoglobin: 9.4 g/dL — ABNORMAL LOW (ref 12.0–15.0)
MCH: 30.7 pg (ref 26.0–34.0)
MCHC: 33.1 g/dL (ref 30.0–36.0)
MCV: 92.8 fL (ref 80.0–100.0)
Platelets: 248 10*3/uL (ref 150–400)
RBC: 3.06 MIL/uL — ABNORMAL LOW (ref 3.87–5.11)
RDW: 14.4 % (ref 11.5–15.5)
WBC: 9.5 10*3/uL (ref 4.0–10.5)
nRBC: 0 % (ref 0.0–0.2)

## 2022-03-08 MED ORDER — NIFEDIPINE 10 MG PO CAPS: 20.0000 mg | ORAL_CAPSULE | ORAL | Status: DC | PRN

## 2022-03-08 MED ORDER — IBUPROFEN 600 MG PO TABS
600.0000 mg | ORAL_TABLET | Freq: Once | ORAL | Status: AC
  Administered 2022-03-08: 600 mg via ORAL
  Filled 2022-03-08: qty 1

## 2022-03-08 MED ORDER — FUROSEMIDE 20 MG PO TABS
20.0000 mg | ORAL_TABLET | Freq: Every day | ORAL | Status: DC
Start: 2022-03-08 — End: 2022-03-09
  Administered 2022-03-08 – 2022-03-09 (×2): 20 mg via ORAL
  Filled 2022-03-08 (×2): qty 1

## 2022-03-08 MED ORDER — NIFEDIPINE 10 MG PO CAPS: 10.0000 mg | ORAL_CAPSULE | ORAL | Status: DC | PRN

## 2022-03-08 MED ORDER — NIFEDIPINE ER OSMOTIC RELEASE 30 MG PO TB24
30.0000 mg | ORAL_TABLET | Freq: Every day | ORAL | Status: DC
Start: 2022-03-08 — End: 2022-03-11
  Administered 2022-03-08 – 2022-03-10 (×3): 30 mg via ORAL
  Filled 2022-03-08 (×3): qty 1

## 2022-03-08 MED ORDER — LABETALOL HCL 5 MG/ML IV SOLN: 40.0000 mg | INTRAVENOUS | Status: DC | PRN

## 2022-03-08 MED ORDER — ACETAMINOPHEN 500 MG PO TABS
1000.0000 mg | ORAL_TABLET | Freq: Once | ORAL | Status: AC
  Administered 2022-03-08: 1000 mg via ORAL
  Filled 2022-03-08: qty 2

## 2022-03-08 NOTE — MAU Provider Note (Addendum)
History     CSN: [id number]  Arrival date and time: 03/08/22 1806     Chief Complaint  Patient presents with   Shortness of Breath   HPI Mary Mccall is a 36 y.o. G1P1001 patient. She is 4 days post-op from a primary cesarean for breech presentation. Patient reports chief complaint of shortness of breath. She states she was lying down last night, felt short of breath and heard a "gurgling" in her throat. She repositioned herself to an upright position and experienced milder but persistent SOB. She continues to feel SOB today. She denies hx of hypertension. She denies headache, visual disturbances.  Patient also reports "chest tightness". She denies weakness, syncope. She is not concerned about her incision and is not experiencing problems with breastfeeding.  Patient receives care with [facility].  OB History     Gravida  1   Para  1   Term  1   Preterm      AB      Living  1      SAB      IAB      Ectopic      Multiple  0   Live Births  1           History reviewed. No pertinent past medical history.  Past Surgical History:  Procedure Laterality Date   CESAREAN SECTION N/A 03/04/2022   Procedure: Primary CESAREAN SECTION;  Surgeon: Clance Boll A, DO;  Location: [facility];  Service: Obstetrics;  Laterality: N/A;  EDD: 03/09/22   FOOT SURGERY      Family History  Problem Relation Age of Onset   Stroke Father     Social History   Tobacco Use   Smoking status: Never   Smokeless tobacco: Never  Vaping Use   Vaping Use: Never used  Substance Use Topics   Alcohol use: Never   Drug use: Never    Allergies: No Known Allergies  Medications Prior to Admission  Medication Sig Dispense Refill Last Dose   acetaminophen (TYLENOL) 500 MG tablet Take 2 tablets (1,000 mg total) by mouth every 6 (six) hours. 30 tablet 0 03/08/2022   ibuprofen (ADVIL) 600 MG tablet Take 1 tablet (600 mg total) by mouth every 6 (six) hours. 30 tablet 0 03/08/2022    coconut oil OIL Apply 1 application. topically as needed.  0    iron polysaccharides (FERREX 150) 150 MG capsule Take 1 capsule (150 mg total) by mouth daily.      magnesium oxide (MAG-OX) 400 (240 Mg) MG tablet Take 1 tablet (400 mg total) by mouth daily. For prevention of constipation. 30 tablet     oxyCODONE (OXY IR/ROXICODONE) 5 MG immediate release tablet Take 1 tablet (5 mg total) by mouth every 6 (six) hours as needed for up to 5 days for moderate pain. 18 tablet 0    Prenatal Vit-Fe Fumarate-FA (PRENATAL MULTIVITAMIN) TABS tablet Take 1 tablet by mouth daily at 12 noon.      senna-docusate (SENOKOT-S) 8.6-50 MG tablet Take 2 tablets by mouth daily.       Review of Systems  Respiratory:  Positive for shortness of breath.   All other systems reviewed and are negative.  Physical Exam   Blood pressure (!) 150/91, pulse 71, temperature 99 F (37.2 C), resp. rate 20, height 5\' 5"  (1.651 m), weight 84.4 kg, SpO2 98 %, unknown if currently breastfeeding.  Physical Exam Vitals and nursing note reviewed.  Constitutional:  Appearance: She is well-developed.  Cardiovascular:     Rate and Rhythm: Normal rate and regular rhythm.  Pulmonary:     Effort: Pulmonary effort is normal.     Breath sounds: Normal breath sounds.  Skin:    Capillary Refill: Capillary refill takes less than 2 seconds.     Comments: Honeycomb dressing well applied to cesarean incision. C/D/I  Neurological:     Mental Status: She is alert and oriented to person, place, and time.  Psychiatric:        Mood and Affect: Mood normal.        Behavior: Behavior normal.   Breast exam declined by patient  MAU Course  Procedures  MDM Orders Placed This Encounter  Procedures   DG Chest Port 1 View   CBC   Comprehensive metabolic panel   Measure blood pressure   ED EKG   Patient Vitals for the past 24 hrs:  BP Temp Pulse Resp SpO2 Height Weight  03/08/22 2116 (!) 150/91 -- 71 -- -- -- --  03/08/22 2101 (!)  151/89 -- 70 -- -- -- --  03/08/22 2046 (!) 140/91 -- 69 -- -- -- --  03/08/22 2031 136/82 -- 68 -- -- -- --  03/08/22 2016 (!) 141/88 -- 69 -- -- -- --  03/08/22 2004 (!) 140/91 -- 74 -- -- -- --  03/08/22 1931 (!) 148/89 -- 71 -- -- -- --  03/08/22 1916 (!) 110/94 -- 73 -- 98 % -- --  03/08/22 1900 (!) 144/89 -- 82 -- 97 % -- --  03/08/22 1846 (!) 144/87 -- 76 -- -- -- --  03/08/22 1840 (!) 141/90 -- 79 -- -- -- --  03/08/22 1826 (!) 142/86 99 F (37.2 C) 75 20 97 % 5\' 5"  (1.651 m) 84.4 kg   DG Chest Port 1 View  Result Date: 03/08/2022 CLINICAL DATA:  Shortness of breath. EXAM: PORTABLE CHEST 1 VIEW COMPARISON:  None Available. FINDINGS: The cardiac silhouette is mildly enlarged. Moderate to marked severity areas of atelectasis and/or infiltrate are seen within the bilateral lung bases, right slightly greater than left. Small bilateral pleural effusions are suspected. No pneumothorax is identified. The visualized skeletal structures are unremarkable. IMPRESSION: 1. Moderate to marked severity bibasilar atelectasis and/or infiltrate, right slightly greater than left. 2. Small bilateral pleural effusions. Electronically Signed   By: Aram Candelahaddeus  Houston M.D.   On: 03/08/2022 19:23    Report given to R. Arita Missawson, CNM who assumes care at this time.  Clayton BiblesSamantha Weinhold, MSA, MSN, CNM Certified Nurse Midwife, Faculty Practice 03/08/22 8:01 PM  *Consult with Dr. Vergie LivingPickens @ 2106 - notified of patient's complaints, assessments, lab, and EKG results, recommended tx plan admit initiate Lasix and Procardia, she would need either Ativan or Keppra, call private MD  *TC to Dr. Billy Coastaavon @ 2113 - notified of patient's complaints, assessments, lab, EKG results, and Dr. Vergie LivingPickens recommendations - ok to admit for 24 hours observation - Dr. Billy Coastaavon will see patient in the AM.   Reassessment @ 2115: Patient reports decreased SOB now. Discussed recommendations for admission. Questions answered to explain the elevated  LFTs and how Lasix and Procardia will be administered. Advised Lasix will cause increase urination. Advised to increase water intake to prevent decreased breastmilk. Patient verbalized an understanding of the plan of care and agrees.   Assessment and Plan  Preeclampsia in postpartum period  - Admit to OBSCU - PP PEC admission orders - Dr. Billy Coastaavon assumes care of patient upon admission  Raelyn Mora, CNM 03/08/2022 9:39 PM

## 2022-03-08 NOTE — MAU Note (Addendum)
.  [name] is a 36 y.o. at Unknown here in MAU reporting: PP c-section on 03/04/22. Last night reports she was laying down and felt SOB and heard a "gurgling" sound in her throat. Sat  up tried to sleep sitting more up right but still feeling a little SOB. Today still feeling SOB and may have some reflux as well. Denies  any headache or visual changes. Reports some chest tighness as well.  LMP:  Onset of complaint: last night Pain score: 3 Vitals:   03/08/22 1826  BP: (!) 142/86  Pulse: 75  Resp: 20  Temp: 99 F (37.2 C)  SpO2: 97%     FHT:n/a Lab orders placed from triage:

## 2022-03-09 LAB — CBC WITH DIFFERENTIAL/PLATELET
Abs Immature Granulocytes: 0.04 10*3/uL (ref 0.00–0.07)
Basophils Absolute: 0 10*3/uL (ref 0.0–0.1)
Basophils Relative: 0 %
Eosinophils Absolute: 0.1 10*3/uL (ref 0.0–0.5)
Eosinophils Relative: 1 %
HCT: 28.6 % — ABNORMAL LOW (ref 36.0–46.0)
Hemoglobin: 9.1 g/dL — ABNORMAL LOW (ref 12.0–15.0)
Immature Granulocytes: 1 %
Lymphocytes Relative: 17 %
Lymphs Abs: 1.4 10*3/uL (ref 0.7–4.0)
MCH: 29.7 pg (ref 26.0–34.0)
MCHC: 31.8 g/dL (ref 30.0–36.0)
MCV: 93.5 fL (ref 80.0–100.0)
Monocytes Absolute: 0.5 10*3/uL (ref 0.1–1.0)
Monocytes Relative: 6 %
Neutro Abs: 6 10*3/uL (ref 1.7–7.7)
Neutrophils Relative %: 75 %
Platelets: 254 10*3/uL (ref 150–400)
RBC: 3.06 MIL/uL — ABNORMAL LOW (ref 3.87–5.11)
RDW: 14.4 % (ref 11.5–15.5)
WBC: 8.1 10*3/uL (ref 4.0–10.5)
nRBC: 0 % (ref 0.0–0.2)

## 2022-03-09 LAB — COMPREHENSIVE METABOLIC PANEL
ALT: 78 U/L — ABNORMAL HIGH (ref 0–44)
AST: 66 U/L — ABNORMAL HIGH (ref 15–41)
Albumin: 2.5 g/dL — ABNORMAL LOW (ref 3.5–5.0)
Alkaline Phosphatase: 131 U/L — ABNORMAL HIGH (ref 38–126)
Anion gap: 4 — ABNORMAL LOW (ref 5–15)
BUN: 15 mg/dL (ref 6–20)
CO2: 23 mmol/L (ref 22–32)
Calcium: 8 mg/dL — ABNORMAL LOW (ref 8.9–10.3)
Chloride: 113 mmol/L — ABNORMAL HIGH (ref 98–111)
Creatinine, Ser: 0.9 mg/dL (ref 0.44–1.00)
GFR, Estimated: >60 mL/min (ref >60)
Glucose, Bld: 95 mg/dL (ref 70–99)
Potassium: 4.3 mmol/L (ref 3.5–5.1)
Sodium: 140 mmol/L (ref 135–145)
Total Bilirubin: 0.4 mg/dL (ref 0.3–1.2)
Total Protein: 4.7 g/dL — ABNORMAL LOW (ref 6.5–8.1)

## 2022-03-09 LAB — D-DIMER, QUANTITATIVE: D-Dimer, Quant: 12.32 ug/mL-FEU — ABNORMAL HIGH (ref 0.00–0.50)

## 2022-03-09 MED ORDER — FUROSEMIDE 20 MG PO TABS
20.0000 mg | ORAL_TABLET | Freq: Two times a day (BID) | ORAL | Status: DC
  Administered 2022-03-09 – 2022-03-10 (×3): 20 mg via ORAL
  Filled 2022-03-09 (×3): qty 1

## 2022-03-09 NOTE — H&P (Addendum)
History and Physical examination     CSN: [id number]  Arrival date and time: 03/08/22 1806     Chief Complaint  Patient presents with   Shortness of Breath   HPI Mary Mccall is a 36 y.o. G1P1001 patient. She is 4 days post-op from a primary cesarean for breech presentation. Patient reports chief complaint of shortness of breath. She states she was lying down last night, felt short of breath and heard a "gurgling" in her throat. She repositioned herself to an upright position and experienced milder but persistent SOB. She continues to feel SOB today. She denies hx of hypertension. She denies headache, visual disturbances.  Patient also reports "chest tightness". She denies weakness, syncope. She is not concerned about her incision and is not experiencing problems with breastfeeding.  Patient receives care with [facility]. Uncomplicated antepartum care and intrapartum course.  OB History     Gravida  1   Para  1   Term  1   Preterm      AB      Living  1      SAB      IAB      Ectopic      Multiple  0   Live Births  1           History reviewed. No pertinent past medical history.  Past Surgical History:  Procedure Laterality Date   CESAREAN SECTION N/A 03/04/2022   Procedure: Primary CESAREAN SECTION;  Surgeon: Clance Boll A, DO;  Location: [facility];  Service: Obstetrics;  Laterality: N/A;  EDD: 03/09/22   FOOT SURGERY      Family History  Problem Relation Age of Onset   Stroke Father     Social History   Tobacco Use   Smoking status: Never   Smokeless tobacco: Never  Vaping Use   Vaping Use: Never used  Substance Use Topics   Alcohol use: Never   Drug use: Never    Allergies: No Known Allergies  Medications Prior to Admission  Medication Sig Dispense Refill Last Dose   acetaminophen (TYLENOL) 500 MG tablet Take 2 tablets (1,000 mg total) by mouth every 6 (six) hours. 30 tablet 0 03/08/2022   ibuprofen (ADVIL) 600 MG tablet Take 1  tablet (600 mg total) by mouth every 6 (six) hours. 30 tablet 0 03/08/2022   coconut oil OIL Apply 1 application. topically as needed.  0    iron polysaccharides (FERREX 150) 150 MG capsule Take 1 capsule (150 mg total) by mouth daily.      magnesium oxide (MAG-OX) 400 (240 Mg) MG tablet Take 1 tablet (400 mg total) by mouth daily. For prevention of constipation. 30 tablet     oxyCODONE (OXY IR/ROXICODONE) 5 MG immediate release tablet Take 1 tablet (5 mg total) by mouth every 6 (six) hours as needed for up to 5 days for moderate pain. 18 tablet 0    Prenatal Vit-Fe Fumarate-FA (PRENATAL MULTIVITAMIN) TABS tablet Take 1 tablet by mouth daily at 12 noon.      senna-docusate (SENOKOT-S) 8.6-50 MG tablet Take 2 tablets by mouth daily.       Review of Systems  Constitutional: Negative.   Respiratory:  Positive for shortness of breath.   All other systems reviewed and are negative.  Physical Exam   Blood pressure 129/89, pulse 89, temperature 98.4 F (36.9 C), temperature source Oral, resp. rate 16, height 5\' 5"  (1.651 m), weight 84.4 kg, SpO2 97 %, currently  breastfeeding.  Physical Exam Vitals and nursing note reviewed.  Constitutional:      Appearance: She is well-developed.  HENT:     Head: Normocephalic and atraumatic.  Cardiovascular:     Rate and Rhythm: Normal rate and regular rhythm.  Pulmonary:     Effort: Pulmonary effort is normal.     Breath sounds: Examination of the right-lower field reveals decreased breath sounds. Examination of the left-lower field reveals decreased breath sounds. Decreased breath sounds present.  Abdominal:     General: Bowel sounds are normal.     Palpations: Abdomen is soft.  Musculoskeletal:        General: Normal range of motion.  Skin:    General: Skin is warm and dry.     Capillary Refill: Capillary refill takes less than 2 seconds.     Comments: Honeycomb dressing well applied to cesarean incision. C/D/I  Neurological:     General: No focal  deficit present.     Mental Status: She is alert and oriented to person, place, and time.  Psychiatric:        Mood and Affect: Mood normal.        Behavior: Behavior normal.     MAU Course  Procedures CBC    Component Value Date/Time   WBC 9.5 03/08/2022 1933   RBC 3.06 (L) 03/08/2022 1933   HGB 9.4 (L) 03/08/2022 1933   HCT 28.4 (L) 03/08/2022 1933   PLT 248 03/08/2022 1933   MCV 92.8 03/08/2022 1933   MCH 30.7 03/08/2022 1933   MCHC 33.1 03/08/2022 1933   RDW 14.4 03/08/2022 1933   CMP     Component Value Date/Time   NA 138 03/08/2022 1933   K 4.4 03/08/2022 1933   CL 109 03/08/2022 1933   CO2 20 (L) 03/08/2022 1933   GLUCOSE 93 03/08/2022 1933   BUN 15 03/08/2022 1933   CREATININE 0.80 03/08/2022 1933   CALCIUM 8.2 (L) 03/08/2022 1933   PROT 5.2 (L) 03/08/2022 1933   ALBUMIN 2.7 (L) 03/08/2022 1933   AST 125 (H) 03/08/2022 1933   ALT 107 (H) 03/08/2022 1933   ALKPHOS 143 (H) 03/08/2022 1933   BILITOT 0.6 03/08/2022 1933   GFRNONAA >60 03/08/2022 1933      Patient Vitals for the past 24 hrs:  BP Temp Temp src Pulse Resp SpO2 Height Weight  03/09/22 0627 129/89 98.4 F (36.9 C) Oral 89 16 97 % -- --  03/09/22 0207 126/87 98 F (36.7 C) Oral (!) 104 16 100 % -- --  03/08/22 2157 139/84 98 F (36.7 C) Oral 89 20 100 % -- --  03/08/22 2146 (!) 146/92 -- -- 86 -- -- -- --  03/08/22 2131 (!) 147/93 -- -- 75 -- -- -- --  03/08/22 2116 (!) 150/91 -- -- 71 -- -- -- --  03/08/22 2101 (!) 151/89 -- -- 70 -- -- -- --  03/08/22 2046 (!) 140/91 -- -- 69 -- -- -- --  03/08/22 2031 136/82 -- -- 68 -- -- -- --  03/08/22 2016 (!) 141/88 -- -- 69 -- -- -- --  03/08/22 2004 (!) 140/91 -- -- 74 -- -- -- --  03/08/22 1931 (!) 148/89 -- -- 71 -- -- -- --  03/08/22 1916 (!) 110/94 -- -- 73 -- 98 % -- --  03/08/22 1900 (!) 144/89 -- -- 82 -- 97 % -- --  03/08/22 1846 (!) 144/87 -- -- 76 -- -- -- --  03/08/22 1840 Marland Kitchen)  141/90 -- -- 79 -- -- -- --  03/08/22 1826 (!) 142/86 99  F (37.2 C) -- 75 20 97 % 5\' 5"  (1.651 m) 84.4 kg    DG Chest Port 1 View  Result Date: 03/08/2022 CLINICAL DATA:  Shortness of breath. EXAM: PORTABLE CHEST 1 VIEW COMPARISON:  None Available. FINDINGS: The cardiac silhouette is mildly enlarged. Moderate to marked severity areas of atelectasis and/or infiltrate are seen within the bilateral lung bases, right slightly greater than left. Small bilateral pleural effusions are suspected. No pneumothorax is identified. The visualized skeletal structures are unremarkable. IMPRESSION: 1. Moderate to marked severity bibasilar atelectasis and/or infiltrate, right slightly greater than left. 2. Small bilateral pleural effusions. Electronically Signed   By: Aram Candelahaddeus  Houston M.D.   On: 03/08/2022 19:23      Assessment and Plan  PP PEC- inc BP( non severe) and elevated LFTs. CXR c/w pulmonary edema. Doubt PE. 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 - Admit to OBSCU - PP PEC admission orders Lasix diuresis. Low dose nifedipine. May need echo. Add D- dimer to rpt labs.Strict I/Os. Will monitor as inpt.  60minute encounter with 50% spent in counseling, consultation and chart review.  Olivia Mackieichard Pamelyn Bancroft, MD

## 2022-03-09 NOTE — Progress Notes (Signed)
Patient ID: GERALDIN HABERMEHL, female   DOB: 26-Oct-1985, 36 y.o.   MRN: [id number] PP pulmonary edema HD 2 S; Feeling better. Slept well with no SOB or awakening. No headaches, visual changes today. No abd pain.  O: BP 129/89 (BP Location: Right Arm)   Pulse 89   Temp 98.4 F (36.9 C) (Oral)   Resp 16   Ht 5\' 5"  (1.651 m)   Wt 84.4 kg   SpO2 97%   Breastfeeding Yes   BMI 30.95 kg/m    NCAT NAD comfortable on RA Lungs: CTA CV: RRR ABd: Non tender, no RUQ tenderness, Incison c/d/I Ext: tr edema, 2+ DTRs Neuro: nonfocal Skin: intact  I/O -2950 since admission  PP PEC-  clinically improved with marked diuresis on PO Lasix. Continue Lasix and Nifedipine. Rpt labs today.  Consider echo if no continue clinical improvement. No Mag given due to Pulm edema and non severe BP changes. Will monitor closely.  spent with patient care. > 50% in chart review, consultation and counseling.

## 2022-03-10 ENCOUNTER — Observation Stay (HOSPITAL_BASED_OUTPATIENT_CLINIC_OR_DEPARTMENT_OTHER): Payer: 59

## 2022-03-10 ENCOUNTER — Observation Stay (HOSPITAL_COMMUNITY): Payer: 59

## 2022-03-10 ENCOUNTER — Encounter (HOSPITAL_COMMUNITY): Payer: Self-pay | Admitting: Obstetrics and Gynecology

## 2022-03-10 DIAGNOSIS — O1495 Unspecified pre-eclampsia, complicating the puerperium: Secondary | ICD-10-CM | POA: Diagnosis not present

## 2022-03-10 DIAGNOSIS — R0609 Other forms of dyspnea: Secondary | ICD-10-CM | POA: Diagnosis not present

## 2022-03-10 LAB — COMPREHENSIVE METABOLIC PANEL
ALT: 68 U/L — ABNORMAL HIGH (ref 0–44)
ALT: 69 U/L — ABNORMAL HIGH (ref 0–44)
AST: 52 U/L — ABNORMAL HIGH (ref 15–41)
AST: 47 U/L — ABNORMAL HIGH (ref 15–41)
Albumin: 2.6 g/dL — ABNORMAL LOW (ref 3.5–5.0)
Albumin: 3.2 g/dL — ABNORMAL LOW (ref 3.5–5.0)
Alkaline Phosphatase: 128 U/L — ABNORMAL HIGH (ref 38–126)
Alkaline Phosphatase: 150 U/L — ABNORMAL HIGH (ref 38–126)
Anion gap: 7 (ref 5–15)
Anion gap: 8 (ref 5–15)
BUN: 17 mg/dL (ref 6–20)
BUN: 16 mg/dL (ref 6–20)
CO2: 23 mmol/L (ref 22–32)
CO2: 23 mmol/L (ref 22–32)
Calcium: 8 mg/dL — ABNORMAL LOW (ref 8.9–10.3)
Calcium: 8.5 mg/dL — ABNORMAL LOW (ref 8.9–10.3)
Chloride: 110 mmol/L (ref 98–111)
Chloride: 108 mmol/L (ref 98–111)
Creatinine, Ser: 0.94 mg/dL (ref 0.44–1.00)
Creatinine, Ser: 0.93 mg/dL (ref 0.44–1.00)
GFR, Estimated: >60 mL/min (ref >60)
GFR, Estimated: >60 mL/min (ref >60)
Glucose, Bld: 90 mg/dL (ref 70–99)
Glucose, Bld: 94 mg/dL (ref 70–99)
Potassium: 4 mmol/L (ref 3.5–5.1)
Potassium: 4.1 mmol/L (ref 3.5–5.1)
Sodium: 140 mmol/L (ref 135–145)
Sodium: 139 mmol/L (ref 135–145)
Total Bilirubin: 0.6 mg/dL (ref 0.3–1.2)
Total Bilirubin: 0.7 mg/dL (ref 0.3–1.2)
Total Protein: 5 g/dL — ABNORMAL LOW (ref 6.5–8.1)
Total Protein: 5.6 g/dL — ABNORMAL LOW (ref 6.5–8.1)

## 2022-03-10 LAB — CBC
HCT: 29.9 % — ABNORMAL LOW (ref 36.0–46.0)
Hemoglobin: 9.8 g/dL — ABNORMAL LOW (ref 12.0–15.0)
MCH: 30.3 pg (ref 26.0–34.0)
MCHC: 32.8 g/dL (ref 30.0–36.0)
MCV: 92.6 fL (ref 80.0–100.0)
Platelets: 245 10*3/uL (ref 150–400)
RBC: 3.23 MIL/uL — ABNORMAL LOW (ref 3.87–5.11)
RDW: 14.2 % (ref 11.5–15.5)
WBC: 7.5 10*3/uL (ref 4.0–10.5)
nRBC: 0 % (ref 0.0–0.2)

## 2022-03-10 LAB — ECHOCARDIOGRAM COMPLETE
Area-P 1/2: 3.5 cm2
Height: 65 in
S' Lateral: 2.7 cm
Weight: 2976 oz

## 2022-03-10 LAB — BRAIN NATRIURETIC PEPTIDE: B Natriuretic Peptide: 649.9 pg/mL — ABNORMAL HIGH (ref 0.0–100.0)

## 2022-03-10 MED ORDER — FERUMOXYTOL INJECTION 510 MG/17 ML
510.0000 mg | Freq: Once | INTRAVENOUS | Status: AC
  Administered 2022-03-10: 510 mg via INTRAVENOUS
  Filled 2022-03-10: qty 17

## 2022-03-10 MED ORDER — SODIUM CHLORIDE 0.9 % IV SOLN
INTRAVENOUS | Status: DC | PRN
  Administered 2022-03-10: 10 mL via INTRAVENOUS

## 2022-03-10 MED ORDER — NIFEDIPINE ER 30 MG PO TB24: 30.0000 mg | ORAL_TABLET | Freq: Every day | ORAL | 0 refills | Status: DC

## 2022-03-10 MED ORDER — FUROSEMIDE 20 MG PO TABS: 20.0000 mg | ORAL_TABLET | Freq: Every day | ORAL | 0 refills | Status: DC

## 2022-03-10 NOTE — Progress Notes (Signed)
Hospital day #3 Postpartum pulmonary edema in the setting of preeclampsia  Subjective: Patient notes feeling better each day with improved shortness of breath and improved edema in total overnight.  Patient woke up to void and felt short of breath then noted difficulty laying flat to sleep.  Patient notes when she feels short of breath she also feels "wheezy".  She denies cough or productive sputum.  Patient notes no headache, no right upper quadrant pain, no emesis and is tolerating a regular diet.  Patient notes incision is healing well without significant pain.  Patient notes marked improvement to her swelling.  Patient does note good urine production after Lasix but this effect is decreasing.  Patient notes adequate milk production.  Patient notes no sick contacts, no fevers  Objective: Vitals:   03/10/22 0546 03/10/22 0551 03/10/22 0611 03/10/22 0855  BP:    131/90  Pulse:    75  Resp:    18  Temp:    98.2 F (36.8 C)  TempSrc:    Oral  SpO2: 92% 94% 96% 96%  Weight:      Height:       General: Well-appearing, no distress Cardiovascular: Regular rate and rhythm, no murmur Pulmonary: No audible wheezes, good air movement.  No accessory muscles for breathing Abdomen: Soft, postpartum with fundus below the umbilicus, no right upper quadrant pain, no fundal tenderness Incision: Clean and dry, Steri-Strips placed GU: Deferred Lower extremity: Nontender, no edema, no clonus, 1+ DTR     Latest Ref Rng & Units 03/10/2022    5:18 AM 03/09/2022    4:57 PM 03/08/2022    7:33 PM  CBC  WBC 4.0 - 10.5 K/uL 7.5  8.1  9.5   Hemoglobin 12.0 - 15.0 g/dL 9.8  9.1  9.4   Hematocrit 36.0 - 46.0 % 29.9  28.6  28.4   Platelets 150 - 400 K/uL 245  254  248        Latest Ref Rng & Units 03/10/2022    5:18 AM 03/09/2022    4:57 PM 03/08/2022    7:33 PM  CMP  Glucose 70 - 99 mg/dL 90  95  93   BUN 6 - 20 mg/dL 17  15  15    Creatinine 0.44 - 1.00 mg/dL 0.94  0.90  0.80   Sodium 135 - 145 mmol/L 140   140  138   Potassium 3.5 - 5.1 mmol/L 4.0  4.3  4.4   Chloride 98 - 111 mmol/L 110  113  109   CO2 22 - 32 mmol/L 23  23  20    Calcium 8.9 - 10.3 mg/dL 8.0  8.0  8.2   Total Protein 6.5 - 8.1 g/dL 5.0  4.7  5.2   Total Bilirubin 0.3 - 1.2 mg/dL 0.6  0.4  0.6   Alkaline Phos 38 - 126 U/L 128  131  143   AST 15 - 41 U/L 52  66  125   ALT 0 - 44 U/L 68  78  107    BNP    Component Value Date/Time   BNP 649.9 (H) 03/10/2022 0518   Chest x-ray: Small bilateral pleural effusions, diffuse peribronchial cuffing and interstitial prominence throughout the mid to lower lungs with some focal airspace consolidations.  Blunting of both costophrenic sulcus I.  No pneumothorax.  Normal heart size  Assessment and plan: Postpartum day 6, hospital day #3 on readmit for pulmonary edema and preeclampsia.  Preeclampsia is improving with  decrease in LFTs and blood pressures are in normal range on Procardia 30 XL daily.  Patient has no symptoms of headache or edema at this time.  Patient with improvement to pulmonary edema and symptoms of shortness of breath with Lasix however despite aggressive diuresis she still exhibits shortness of breath once the Lasix effect has worn off.  This and her elevated BNP I feel it is reasonable to consult cardiology for echocardiogram to evaluate for postpartum cardiomyopathy.  I have reached out to cardiology and awaiting return phone call.  -Anemia.  We will plan IV iron.  45 minutes spent with patient and coordinating care today  Ala Dach 03/10/2022 10:48 AM

## 2022-03-10 NOTE — Progress Notes (Signed)
Patient agreeable to staying another night.  She requests to speak to Dr. Juliene Pina this evening.  Phoned Dr. Juliene Pina and shared above information.

## 2022-03-10 NOTE — Discharge Summary (Signed)
Physician Discharge Summary  Patient ID: Mary Mccall MRN: [id number] DOB/AGE: July 30, 1986 36 y.o.  Admit date: 03/08/2022 Discharge date: 03/10/2022  Admission Diagnoses: Postpartum preeclampsia with pulmonary edema. Postpartum day 5   Discharge Diagnoses:  Principal Problem:   Preeclampsia in postpartum period Elevated liver enzymes improving  Pulmonary edema resolved  Minimal pericardia effusion and see details on Echo report   Discharged Condition: good  Hospital Course: Patient presented on 03/08/22, postpartum day 5 after uncomplicated scheduled LTC/section on 6/6 for breech. She was normotensive and was discharged on 03/06/22 with no complaints. She presented on 6/10 with shortness of breath and was diagnosed with Preeclampsia with non-severe BPs and no neural symptoms but pulmonary edema. She had elevated LFTs, normal platelets and normal creatinine. She was started on Procardia 30mg  XL and Lasix 20mg  daily that was increased to BID on 03/09/22 due to recurrent shortness of breath the following night. Cardiology consult done. Echo noted normal EF, slightly dilated left atrium and minimal pericardial effusion but they agreed with good recovery and discharge planning. Pt reported home weight on 6/10 being 185 lbs (192 was last Ob weight) and weight on 6/12 was down to 172 lbs- 13 lb weight loss from diuresis.   Consults: cardiology  Significant Diagnostic Studies: Echocardiogram and elevated BNP      Latest Ref Rng & Units 03/10/2022    5:18 AM 03/09/2022    4:57 PM 03/08/2022    7:33 PM  CBC  WBC 4.0 - 10.5 K/uL 7.5  8.1  9.5   Hemoglobin 12.0 - 15.0 g/dL 9.8  9.1  9.4   Hematocrit 36.0 - 46.0 % 29.9  28.6  28.4   Platelets 150 - 400 K/uL 245  254  248        Latest Ref Rng & Units 03/10/2022    6:20 PM 03/10/2022    5:18 AM 03/09/2022    4:57 PM  CMP  Glucose 70 - 99 mg/dL 94  90  95   BUN 6 - 20 mg/dL 16  17  15    Creatinine 0.44 - 1.00 mg/dL 05/10/2022  05/09/2022    Sodium  135 - 145 mmol/L 139  140  140   Potassium 3.5 - 5.1 mmol/L 4.1  4.0  4.3   Chloride 98 - 111 mmol/L 108  110  113   CO2 22 - 32 mmol/L 23  23  23    Calcium 8.9 - 10.3 mg/dL 8.5  8.0  8.0   Total Protein 6.5 - 8.1 g/dL 5.6  5.0  4.7   Total Bilirubin 0.3 - 1.2 mg/dL 0.7  0.6  0.4   Alkaline Phos 38 - 126 U/L 150  128  131   AST 15 - 41 U/L 47  52  66   ALT 0 - 44 U/L 69  68  78      Discharge Exam: Blood pressure 129/90, pulse 85, temperature 98.1 F (36.7 C), temperature source Oral, resp. rate 18, height 5\' 5"  (1.651 m), weight 78.7 kg, SpO2 97 %, currently breastfeeding. General appearance: alert, cooperative, and appears stated age Resp: clear to auscultation bilaterally Cardio: regular rate and rhythm, S1, S2 normal, no murmur, click, rub or gallop Extremities: Homans sign is negative, no sign of DVT and no edema, redness or tenderness in the calves or thighs  Disposition: Discharge disposition: 01-Home or Self Care     TAKE LASIX for 3  more days and then as needed if short of breath  and call doctor  Take Procardia 30mg  XL daily until advised by Ob doc   Discharge Instructions     Call MD for:  difficulty breathing, headache or visual disturbances   Complete by: As directed    Call MD for:  extreme fatigue   Complete by: As directed    Call MD for:  hives   Complete by: As directed    Call MD for:  persistant dizziness or light-headedness   Complete by: As directed    Call MD for:  persistant nausea and vomiting   Complete by: As directed    Call MD for:  redness, tenderness, or signs of infection (pain, swelling, redness, odor or green/yellow discharge around incision site)   Complete by: As directed    Call MD for:  severe uncontrolled pain   Complete by: As directed    Call MD for:  temperature >100.4   Complete by: As directed    Diet - low sodium heart healthy   Complete by: As directed    Increase activity slowly   Complete by: As directed    Lifting  restrictions   Complete by: As directed    No heavy lifting for 6 weeks postpartum   No dressing needed   Complete by: As directed    Sexual Activity Restrictions   Complete by: As directed    6 weeks      Allergies as of 03/10/2022   No Known Allergies      Medication List     TAKE these medications    acetaminophen 500 MG tablet Commonly known as: TYLENOL Take 2 tablets (1,000 mg total) by mouth every 6 (six) hours.   coconut oil Oil Apply 1 application. topically as needed.   furosemide 20 MG tablet Commonly known as: LASIX Take 1 tablet (20 mg total) by mouth daily for 12 days.   ibuprofen 600 MG tablet Commonly known as: ADVIL Take 1 tablet (600 mg total) by mouth every 6 (six) hours.   iron polysaccharides 150 MG capsule Commonly known as: Ferrex 150 Take 1 capsule (150 mg total) by mouth daily.   magnesium oxide 400 (240 Mg) MG tablet Commonly known as: MAG-OX Take 1 tablet (400 mg total) by mouth daily. For prevention of constipation.   NIFEdipine 30 MG 24 hr tablet Commonly known as: ADALAT CC Take 1 tablet (30 mg total) by mouth daily. Start taking on: March 11, 2022   oxyCODONE 5 MG immediate release tablet Commonly known as: Oxy IR/ROXICODONE Take 1 tablet (5 mg total) by mouth every 6 (six) hours as needed for up to 5 days for moderate pain.   prenatal multivitamin Tabs tablet Take 1 tablet by mouth daily at 12 noon.   senna-docusate 8.6-50 MG tablet Commonly known as: Senokot-S Take 2 tablets by mouth daily.               Discharge Care Instructions  (From admission, onward)           Start     Ordered   03/10/22 0000  No dressing needed        03/10/22 1947            Follow-up Information     Law, Cassandra A, DO Follow up.   Specialty: Obstetrics and Gynecology Contact information: [address] [place] [phone]                 Signed: [phone] 03/10/2022, 7:49 PM

## 2022-03-10 NOTE — Consult Note (Addendum)
Cardiology Consultation:   Patient ID: UVA RUNKEL MRN: [id number]; DOB: Aug 28, 1986  Admit date: 03/08/2022 Date of Consult: 03/10/2022  PCP:  Oneita Hurt, No   [facility] Providers Cardiologist:  None        Patient Profile:   Mary Mccall is a 36 y.o. female who is currently admitted for C section for single intrauterine pregnancy at 39 weeks 2 days, cardiology is consulted  03/10/2022 for the evaluation of pulmonary edema at the request of Dr Ernestina Penna.  History of Present Illness:   Ms. Rockholt has no significant PMH. She had IVF pregnancy, complicated by preeclampsia and persistent breech presentation, which caused her admission on 03/04/22 for elective C section. She was discharged on 03/06/22. She returned c/o chest tightness, SOB, orthopnea on 03/08/22. She was having difficulty laying down. She was re-admitted for monitor. BP elevated 140-150s systolic. CXR 6/10 showed Moderate to marked severity bibasilar atelectasis and/or infiltrate, right slightly greater than left. She was given Lasix for diuresis and Nifedipine for BP control. She had overall improved symptoms after.   CXR today showed possible atypical multilobar bilateral pneumonia or noncardiogenic pulmonary edema. Overall, aeration has slightly improved. Small bilateral pleural effusions.   Echo  today showed LVEF 60-65%, no RWMA, normal diastolic parameters, normal RV, mild LAE, trivial MR.   History reviewed. No pertinent past medical history.  Past Surgical History:  Procedure Laterality Date   CESAREAN SECTION N/A 03/04/2022   Procedure: Primary CESAREAN SECTION;  Surgeon: Clance Boll A, DO;  Location: [facility] LD ORS;  Service: Obstetrics;  Laterality: N/A;  EDD: 03/09/22   FOOT SURGERY       Home Medications:  Prior to Admission medications   Medication Sig Start Date End Date Taking? Authorizing Provider  acetaminophen (TYLENOL) 500 MG tablet Take 2 tablets (1,000 mg total) by mouth every 6 (six) hours.  03/06/22  Yes Neta Mends, CNM  ibuprofen (ADVIL) 600 MG tablet Take 1 tablet (600 mg total) by mouth every 6 (six) hours. 03/06/22  Yes Arlan Organ C, CNM  coconut oil OIL Apply 1 application. topically as needed. 03/06/22   Neta Mends, CNM  iron polysaccharides (FERREX 150) 150 MG capsule Take 1 capsule (150 mg total) by mouth daily. 03/06/22   Neta Mends, CNM  magnesium oxide (MAG-OX) 400 (240 Mg) MG tablet Take 1 tablet (400 mg total) by mouth daily. For prevention of constipation. 03/06/22   Neta Mends, CNM  oxyCODONE (OXY IR/ROXICODONE) 5 MG immediate release tablet Take 1 tablet (5 mg total) by mouth every 6 (six) hours as needed for up to 5 days for moderate pain. 03/06/22 03/11/22  Neta Mends, CNM  Prenatal Vit-Fe Fumarate-FA (PRENATAL MULTIVITAMIN) TABS tablet Take 1 tablet by mouth daily at 12 noon.    [provider]  senna-docusate (SENOKOT-S) 8.6-50 MG tablet Take 2 tablets by mouth daily. 03/07/22   Neta Mends, CNM    Inpatient Medications: Scheduled Meds:  furosemide  20 mg Oral BID   NIFEdipine  30 mg Oral Daily   Continuous Infusions:  sodium chloride Stopped (03/10/22 1430)   PRN Meds: sodium chloride, NIFEdipine **AND** NIFEdipine **AND** NIFEdipine **AND** labetalol **AND** Measure blood pressure  Allergies:   No Known Allergies  Social History:   Social History   Socioeconomic History   Marital status: Married    Spouse name: Not on file   Number of children: Not on file   Years of education: Not on file  Highest education level: Not on file  Occupational History   Not on file  Tobacco Use   Smoking status: Never   Smokeless tobacco: Never  Vaping Use   Vaping Use: Never used  Substance and Sexual Activity   Alcohol use: Never   Drug use: Never   Sexual activity: Yes  Other Topics Concern   Not on file  Social History Narrative   Not on file   Social Determinants of Health   Financial Resource Strain: Not on file  Food  Insecurity: Not on file  Transportation Needs: Not on file  Physical Activity: Not on file  Stress: Not on file  Social Connections: Not on file  Intimate Partner Violence: Not on file    Family History:    Family History  Problem Relation Age of Onset   Stroke Father      ROS:  Please see the history of present illness.  General:no colds or fevers, no weight changes Skin:no rashes or ulcers HEENT:no blurred vision, no congestion CV:see HPI PUL:see HPI GI:no diarrhea constipation or melena, no indigestion GU:no hematuria, no dysuria MS:no joint pain, no claudication Neuro:no syncope, no lightheadedness Endo:no diabetes, no thyroid disease  All other ROS reviewed and negative.     Physical Exam/Data:   Vitals:   03/10/22 0546 03/10/22 0551 03/10/22 0611 03/10/22 0855  BP:    131/90  Pulse:    75  Resp:    18  Temp:    98.2 F (36.8 C)  TempSrc:    Oral  SpO2: 92% 94% 96% 96%  Weight:      Height:        Intake/Output Summary (Last 24 hours) at 03/10/2022 1552 Last data filed at 03/10/2022 0440 Gross per 24 hour  Intake 360 ml  Output 1600 ml  Net -1240 ml      03/08/2022    6:26 PM 03/04/2022   12:35 PM 02/20/2022    4:01 PM  Last 3 Weights  Weight (lbs) 186 lb 192 lb 182 lb  Weight (kg) 84.369 kg 87.091 kg 82.555 kg     Body mass index is 30.95 kg/m.   Constitutional: No acute distress Eyes: sclera non-icteric, normal conjunctiva and lids ENMT: normal dentition, moist mucous membranes Cardiovascular: regular rhythm, normal rate, no murmur. S1 and S2 normal. No jugular venous distention.  Respiratory: clear to auscultation bilaterally GI : normal bowel sounds, soft and nontender. No distention.   MSK: extremities warm, well perfused. No edema.  NEURO: grossly nonfocal exam, moves all extremities. PSYCH: alert and oriented x 3, normal mood and affect.   EKG:  The EKG was personally reviewed and demonstrates:  SR LAE  Telemetry:  Telemetry was  personally reviewed and demonstrates:  none  Relevant CV Studies:  Echo from 03/10/22:   1. Left ventricular ejection fraction, by estimation, is 60 to 65%. The  left ventricle has normal function. The left ventricle has no regional  wall motion abnormalities. Left ventricular diastolic parameters were  normal.   2. Right ventricular systolic function is normal. The right ventricular  size is normal. There is normal pulmonary artery systolic pressure.   3. Left atrial size was mildly dilated.   4. The mitral valve is normal in structure. Trivial mitral valve  regurgitation.   5. The aortic valve was not well visualized. Aortic valve regurgitation  is not visualized.   6. The inferior vena cava is dilated in size with >50% respiratory  variability, suggesting right atrial  pressure of 8 mmHg.   Comparison(s): No prior Echocardiogram.    Laboratory Data:  High Sensitivity Troponin:  No results for input(s): "TROPONINIHS" in the last 720 hours.   Chemistry Recent Labs  Lab 03/08/22 1933 03/09/22 1657 03/10/22 0518  NA 138 140 140  K 4.4 4.3 4.0  CL 109 113* 110  CO2 20* 23 23  GLUCOSE 93 95 90  BUN 15 15 17   CREATININE 0.80 0.90 0.94  CALCIUM 8.2* 8.0* 8.0*  GFRNONAA >60 >60 >60  ANIONGAP 9 4* 7    Recent Labs  Lab 03/08/22 1933 03/09/22 1657 03/10/22 0518  PROT 5.2* 4.7* 5.0*  ALBUMIN 2.7* 2.5* 2.6*  AST 125* 66* 52*  ALT 107* 78* 68*  ALKPHOS 143* 131* 128*  BILITOT 0.6 0.4 0.6   Lipids No results for input(s): "CHOL", "TRIG", "HDL", "LABVLDL", "LDLCALC", "CHOLHDL" in the last 168 hours.  Hematology Recent Labs  Lab 03/08/22 1933 03/09/22 1657 03/10/22 0518  WBC 9.5 8.1 7.5  RBC 3.06* 3.06* 3.23*  HGB 9.4* 9.1* 9.8*  HCT 28.4* 28.6* 29.9*  MCV 92.8 93.5 92.6  MCH 30.7 29.7 30.3  MCHC 33.1 31.8 32.8  RDW 14.4 14.4 14.2  PLT 248 254 245   Thyroid No results for input(s): "TSH", "FREET4" in the last 168 hours.  BNP Recent Labs  Lab 03/10/22 0518   BNP 649.9*    DDimer  Recent Labs  Lab 03/09/22 1657  DDIMER 12.32*     Radiology/Studies:  ECHOCARDIOGRAM COMPLETE  Result Date: 03/10/2022    ECHOCARDIOGRAM REPORT   Patient Name:   DESTINY HAGIN Date of Exam: 03/10/2022 Medical Rec #:  05/10/2022         Height:       65.0 in Accession #:    [id number]        Weight:       186.0 lb Date of Birth:  November 23, 1985        BSA:          1.918 m Patient Age:    35 years          BP:           131/90 mmHg Patient Gender: F                 HR:           78 bpm. Exam Location:  Inpatient Procedure: 2D Echo, Color Doppler and Cardiac Doppler Indications:    R06.9 DOE  History:        Patient has no prior history of Echocardiogram examinations.  Sonographer:    09-10-1974 Senior RDCS Referring Phys: Irving Burton [id number]  Sonographer Comments: 6 days post-partum IMPRESSIONS  1. Left ventricular ejection fraction, by estimation, is 60 to 65%. The left ventricle has normal function. The left ventricle has no regional wall motion abnormalities. Left ventricular diastolic parameters were normal.  2. Right ventricular systolic function is normal. The right ventricular size is normal. There is normal pulmonary artery systolic pressure.  3. Left atrial size was mildly dilated.  4. The mitral valve is normal in structure. Trivial mitral valve regurgitation.  5. The aortic valve was not well visualized. Aortic valve regurgitation is not visualized.  6. The inferior vena cava is dilated in size with >50% respiratory variability, suggesting right atrial pressure of 8 mmHg. Comparison(s): No prior Echocardiogram. FINDINGS  Left Ventricle: Left ventricular ejection fraction, by estimation, is 60 to 65%. The left ventricle has normal  function. The left ventricle has no regional wall motion abnormalities. The left ventricular internal cavity size was normal in size. There is  no left ventricular hypertrophy. Left ventricular diastolic parameters were normal. Right Ventricle:  The right ventricular size is normal. No increase in right ventricular wall thickness. Right ventricular systolic function is normal. There is normal pulmonary artery systolic pressure. The tricuspid regurgitant velocity is 2.29 m/s, and  with an assumed right atrial pressure of 8 mmHg, the estimated right ventricular systolic pressure is 29.0 mmHg. Left Atrium: Left atrial size was mildly dilated. Right Atrium: Right atrial size was normal in size. Pericardium: There is no evidence of pericardial effusion. Mitral Valve: The mitral valve is normal in structure. Trivial mitral valve regurgitation. Tricuspid Valve: The tricuspid valve is normal in structure. Tricuspid valve regurgitation is mild. Aortic Valve: The aortic valve was not well visualized. Aortic valve regurgitation is not visualized. Pulmonic Valve: The pulmonic valve was grossly normal. Pulmonic valve regurgitation is not visualized. Aorta: The aortic root is normal in size and structure. Venous: The inferior vena cava is dilated in size with greater than 50% respiratory variability, suggesting right atrial pressure of 8 mmHg. IAS/Shunts: No atrial level shunt detected by color flow Doppler.  LEFT VENTRICLE PLAX 2D LVIDd:         4.10 cm   Diastology LVIDs:         2.70 cm   LV e' medial:    14.60 cm/s LV PW:         1.10 cm   LV E/e' medial:  6.5 LV IVS:        0.90 cm   LV e' lateral:   17.90 cm/s LVOT diam:     2.10 cm   LV E/e' lateral: 5.3 LV SV:         78 LV SV Index:   41 LVOT Area:     3.46 cm  RIGHT VENTRICLE RV S prime:     12.00 cm/s TAPSE (M-mode): 2.1 cm LEFT ATRIUM             Index        RIGHT ATRIUM           Index LA diam:        4.50 cm 2.35 cm/m   RA Area:     17.50 cm LA Vol (A2C):   74.2 ml 38.69 ml/m  RA Volume:   44.40 ml  23.15 ml/m LA Vol (A4C):   91.5 ml 47.70 ml/m LA Biplane Vol: 82.8 ml 43.17 ml/m  AORTIC VALVE LVOT Vmax:   107.00 cm/s LVOT Vmean:  75.700 cm/s LVOT VTI:    0.226 m  AORTA Ao Root diam: 2.70 cm Ao Asc  diam:  3.20 cm MITRAL VALVE               TRICUSPID VALVE MV Area (PHT): 3.50 cm    TR Peak grad:   21.0 mmHg MV Decel Time: 217 msec    TR Vmax:        229.00 cm/s MV E velocity: 94.40 cm/s MV A velocity: 42.70 cm/s  SHUNTS MV E/A ratio:  2.21        Systemic VTI:  0.23 m                            Systemic Diam: 2.10 cm Carolan Clines Electronically signed by Carolan Clines Signature Date/Time: 03/10/2022/2:31:53 PM  Final    DG Chest 2 View  Result Date: 03/10/2022 CLINICAL DATA:  36 year old female with history of shortness of breath. EXAM: CHEST - 2 VIEW COMPARISON:  Chest x-ray 03/08/2022. FINDINGS: Lung volumes are normal. Diffuse peribronchial cuffing and interstitial prominence throughout the mid to lower lungs bilaterally with some focal airspace consolidation in the posterior aspects of the right lower lobe. Blunting of both costophrenic sulci suggesting trace bilateral pleural effusions. No pneumothorax. No cephalization of the pulmonary vasculature. Heart size is normal. Upper mediastinal contours are within normal limits. IMPRESSION: 1. The appearance of the chest could suggest atypical multilobar bilateral pneumonia or noncardiogenic pulmonary edema. Overall, aeration has slightly improved compared to the prior study in the lung bases. 2. Small bilateral pleural effusions. Electronically Signed   By: Trudie Reed M.D.   On: 03/10/2022 06:19   DG Chest Port 1 View  Result Date: 03/08/2022 CLINICAL DATA:  Shortness of breath. EXAM: PORTABLE CHEST 1 VIEW COMPARISON:  None Available. FINDINGS: The cardiac silhouette is mildly enlarged. Moderate to marked severity areas of atelectasis and/or infiltrate are seen within the bilateral lung bases, right slightly greater than left. Small bilateral pleural effusions are suspected. No pneumothorax is identified. The visualized skeletal structures are unremarkable. IMPRESSION: 1. Moderate to marked severity bibasilar atelectasis and/or infiltrate, right  slightly greater than left. 2. Small bilateral pleural effusions. Electronically Signed   By: Aram Candela M.D.   On: 03/08/2022 19:23     Assessment and Plan:   Principal Problem:   Preeclampsia in postpartum period  Preeclampsia, postpartum - echo without ventricular abnormality. Mild left atrial enlargement is otherwise unexplained but can be followed with a serial echo. No evidence of post partum cardiomyopathy. Trivial to small pericardial effusion, patient notes no chest pain, unlikely to represent pericarditis.  - continue management of volume with lasix. She looks well and compensated, and ambulates easily around the room. Can likely continue lasix prn 20 mg po homegoing.  - nifedipine added per OB, ok to continue from cardiac perspective.   Will arrange follow up in cardio-ob clinic for 1 mo post partum visit.    Risk Assessment/Risk Scores:        New York Heart Association (NYHA) Functional Class NYHA Class III        For questions or updates, please contact [facility] Please consult [email or web address] for contact info under    Signed, Parke Poisson, MD 03/10/2022 3:52 PM

## 2022-03-10 NOTE — Discharge Instructions (Signed)
Take BP medicine Procardia 30 mg XL for at least 2 weeks, call if BP at home in low 100s / low 60s or having dizziness  Take Lasix (Furosemide) daily for 3 more days and then as needed if short of breath

## 2022-03-10 NOTE — Progress Notes (Signed)
Echocardiogram 2D Echocardiogram has been performed.  Oneal Deputy Madden Garron RDCS 03/10/2022, 2:02 PM

## 2022-03-10 NOTE — Progress Notes (Signed)
Called Dr. Juliene Pina to share that Echocardiogram results are in and that patient has had no further episodes of SOB since 4:00 am today.  Patient is wondering if she can go home tonight.  Dr. Juliene Pina wants to repeat labs tomorrow morning and talk to the cardiologist.   She anticipates discharge home tomorrow morning.  See new orders for Daily Weights, CBC, and CMET.

## 2022-03-10 NOTE — Progress Notes (Signed)
Came to see patient to assess for possible discharge tonight  Denies HA/ SOB/ CP/ swelling/ RUQ pain. Feels well. C/s pain minimal   BP 129/90 (BP Location: Right Arm)   Pulse 85   Temp 98.1 F (36.7 C) (Oral)   Resp 18   Ht 5\' 5"  (1.651 m)   Wt 78.7 kg   SpO2 97%   Breastfeeding Yes   BMI 28.86 kg/m   Exam NAD Lungs CTA b/l CV RRR Extr no c/c/e, DTR +2/+2 Incision NAD Pt report home weight was 185 lbs on 03/07/22 and today down to 172 lbs with excellent diuresis     Latest Ref Rng & Units 03/10/2022    6:20 PM 03/10/2022    5:18 AM 03/09/2022    4:57 PM  CMP  Glucose 70 - 99 mg/dL 94  90  95   BUN 6 - 20 mg/dL 16  17  15    Creatinine 0.44 - 1.00 mg/dL 05/09/2022   9.38   Sodium 135 - 145 mmol/L 139  140  140   Potassium 3.5 - 5.1 mmol/L 4.1  4.0  4.3   Chloride 98 - 111 mmol/L 108  110  113   CO2 22 - 32 mmol/L 23  23  23    Calcium 8.9 - 10.3 mg/dL 8.5  8.0  8.0   Total Protein 6.5 - 8.1 g/dL 5.6  5.0  4.7   Total Bilirubin 0.3 - 1.2 mg/dL 0.7  0.6  0.4   Alkaline Phos 38 - 126 U/L 150  128  131   AST 15 - 41 U/L 47  52  66   ALT 0 - 44 U/L 69  68  78     AST down, ALT unchanged. K nl, Creatinine a bit elevated but unchanged.   Echo report reviewed and spoke with the cardiologist- very small pericardial effusion noted. EF good. NO concern for pp cardiomyopathy okay with discharge and close f/up in office. Okay to continue current BP regimen. Lasix prn  They will arrange for pt to be seen in Ob-Cardiology specialty clinic in 54mo   Proceed with discharge Procardia 30mg  XL daily, home BP monitoring / PP PEC diagnosis dw pt and risk in next pregnancy and bbASA in next pregnancy dw pt. Warning ss and call parameters dw pt Lasix 20 mg daily for 3 more days and then prn shortness of breath  F/up in office w/ Dr 7.51 on 6/14 or 6/15   Time spent 30 minutes  MD

## 2022-03-12 ENCOUNTER — Telehealth (HOSPITAL_COMMUNITY): Payer: Self-pay | Admitting: *Deleted

## 2022-03-12 ENCOUNTER — Telehealth: Payer: Self-pay | Admitting: *Deleted

## 2022-03-12 NOTE — Telephone Encounter (Signed)
Left phone voicemail message.  Duffy Rhody, RN 03-12-2022 at 10:19am

## 2022-03-12 NOTE — Telephone Encounter (Signed)
Pt is scheduled to see Dr. Shari Prows at Cardio OB clinic on 04/11/22 at 1:40 pm.  Pt made aware of appt date and time by [facility] Scheduling.

## 2022-03-12 NOTE — Telephone Encounter (Signed)
-----   Message from Freada Bergeron, MD sent at 03/11/2022  6:48 AM EDT ----- Duanne Guess,  Do you mind adding her to our schedule in the next 3-4weeks?   Thank you so much!! ----- Message ----- From: Elouise Munroe, MD Sent: 03/10/2022   9:59 PM EDT To: Nuala Alpha, LPN; Freada Bergeron, MD  Can we make an appt for Dr. Johney Frame to see this patient in postpartum cardio-ob follow up for post partum preeclampsia? About 3-4 weeks from now would be great. Likely needs 1 visit.  [place]

## 2022-04-09 NOTE — Progress Notes (Signed)
Cardio-Obstetrics Clinic  New Evaluation  Date:  04/11/2022   ID:  Mary Mccall, DOB 1986-08-20, MRN [id number]  PCP:  Pcp, No   [facility] Providers Cardiologist:  None  Electrophysiologist:  None     Referring MD: No ref. provider found   Chief Complaint: Post-partum pre-eclampsia with pulmonary edema  History of Present Illness:    Mary Mccall is a 36 y.o. female [[id number]] who is being seen today for the evaluation of post-partum pre-eclampsia with pulmonary edema at the request of No ref. provider found.   Patient was admitted 02/2022 for pre-eclampsia and persistent breech presentation for elective c-section 03/04/22. She was discharged home on 03/06/22 but returned the the hospital with worsening chest tightness, orthopnea and SOB on 03/08/22. BP elevated 140-150s. CXR with pulmonary edema. She was placed on nifedipine and lasix with improvement. TTE with LVEF 60-65%, no WMA, normal diastolic function, normal RV, mild LAE, trivial MR.  The patient states that she has been doing well since discharge. No chest pain, SOB, lightheadedness, dizziness, LE edema or orthopnea. She has been successfully weaned off nifedipine and lasix and blood pressure is well controlled. She states that prior to delivering, she had developed profound LE edema and 10lbs weight gain in her 39th week.   Prior CV Studies Reviewed: The following studies were reviewed today: Echo from 03/10/22:    1. Left ventricular ejection fraction, by estimation, is 60 to 65%. The  left ventricle has normal function. The left ventricle has no regional  wall motion abnormalities. Left ventricular diastolic parameters were  normal.   2. Right ventricular systolic function is normal. The right ventricular  size is normal. There is normal pulmonary artery systolic pressure.   3. Left atrial size was mildly dilated.   4. The mitral valve is normal in structure. Trivial mitral valve  regurgitation.   5. The  aortic valve was not well visualized. Aortic valve regurgitation  is not visualized.   6. The inferior vena cava is dilated in size with >50% respiratory  variability, suggesting right atrial pressure of 8 mmHg.   Comparison(s): No prior Echocardiogram.   No past medical history on file.  Past Surgical History:  Procedure Laterality Date   CESAREAN SECTION N/A 03/04/2022   Procedure: Primary CESAREAN SECTION;  Surgeon: Clance Boll A, DO;  Location: [facility];  Service: Obstetrics;  Laterality: N/A;  EDD: 03/09/22   FOOT SURGERY        OB History     Gravida  1   Para  1   Term  1   Preterm      AB      Living  1      SAB      IAB      Ectopic      Multiple  0   Live Births  1               Current Medications: Current Meds  Medication Sig   Prenatal Vit-Fe Fumarate-FA (PRENATAL MULTIVITAMIN) TABS tablet Take 1 tablet by mouth daily at 12 noon.     Allergies:   Patient has no known allergies.   Social History   Socioeconomic History   Marital status: Married    Spouse name: Not on file   Number of children: Not on file   Years of education: Not on file   Highest education level: Not on file  Occupational History   Not on file  Tobacco Use  Smoking status: Never   Smokeless tobacco: Never  Vaping Use   Vaping Use: Never used  Substance and Sexual Activity   Alcohol use: Never   Drug use: Never   Sexual activity: Yes  Other Topics Concern   Not on file  Social History Narrative   Not on file   Social Determinants of Health   Financial Resource Strain: Not on file  Food Insecurity: Not on file  Transportation Needs: Not on file  Physical Activity: Not on file  Stress: Not on file  Social Connections: Not on file      Family History  Problem Relation Age of Onset   Stroke Father       ROS:   Please see the history of present illness.    All other systems reviewed and are negative.   Labs/EKG Reviewed:    EKG:   EKG  03/08/22: NSR with HR 75  Recent Labs: 03/10/2022: ALT 69; B Natriuretic Peptide 649.9; BUN 16; Creatinine, Ser 0.93; Hemoglobin 9.8; Platelets 245; Potassium 4.1; Sodium 139   Recent Lipid Panel No results found for: "CHOL", "TRIG", "HDL", "CHOLHDL", "LDLCALC", "LDLDIRECT"  Physical Exam:    VS:  BP 120/80   Pulse 71   Ht 5\' 5"  (1.651 m)   Wt 151 lb 11.2 oz (68.8 kg)   SpO2 97%   BMI 25.24 kg/m     Wt Readings from Last 3 Encounters:  04/11/22 151 lb 11.2 oz (68.8 kg)  03/10/22 173 lb 6.4 oz (78.7 kg)  03/04/22 192 lb (87.1 kg)     GEN:  Well nourished, well developed in no acute distress HEENT: Normal NECK: No JVD; No carotid bruits CARDIAC: RRR, no murmurs, rubs, gallops RESPIRATORY:  Clear to auscultation without rales, wheezing or rhonchi  ABDOMEN: Soft, non-tender, non-distended MUSCULOSKELETAL:  No edema; No deformity  SKIN: Warm and dry NEUROLOGIC:  Alert and oriented x 3 PSYCHIATRIC:  Normal affect    Risk Assessment/Risk Calculators:   {   ASSESSMENT & PLAN:    #Post-partum Pre-Eclampsia with Pulmonary Edema: Patient admitted shortly post-partum with SOB, orthopnea and chest pain found to be hypertensive with pulmonary edema on CXR. She was started on nifedipine and lasix with significant improvement. TTE with normal BiV function and no significant valve disease. Currently she feels much better and has been successfully weaned off antihypertensives and lasix. Discussed that she is higher risk of developing recurrent pre-eclampsia in subsequent pregnancies as well as higher risk of having CV disease later in life including HTN and CAD. She will plan to follow with 05/04/22 for subsequent pregnancies. She will continue prevention efforts going forward with regular PCP follow-ups for cholesterol and A1C checks.   Patient Instructions  Medication Instructions:   Your physician recommends that you continue on your current medications as directed. Please refer to the  Current Medication list given to you today.  *If you need a refill on your cardiac medications before your next appointment, please call your pharmacy*   Follow-Up:  AS NEEDED WITH DR. [place] HERE AT [facility]    Important Information About Sugar         Dispo:  No follow-ups on file.   Medication Adjustments/Labs and Tests Ordered: Current medicines are reviewed at length with the patient today.  Concerns regarding medicines are outlined above.  Tests Ordered: No orders of the defined types were placed in this encounter.  Medication Changes: No orders of the defined types were placed in this encounter.

## 2022-04-11 ENCOUNTER — Ambulatory Visit (INDEPENDENT_AMBULATORY_CARE_PROVIDER_SITE_OTHER): Payer: 59 | Admitting: Cardiology

## 2022-04-11 ENCOUNTER — Encounter: Payer: Self-pay | Admitting: Cardiology

## 2022-04-11 VITALS — BP 120/80 | HR 71 | Ht 65.0 in | Wt 151.7 lb

## 2022-04-11 DIAGNOSIS — J810 Acute pulmonary edema: Secondary | ICD-10-CM

## 2022-04-11 DIAGNOSIS — O1495 Unspecified pre-eclampsia, complicating the puerperium: Secondary | ICD-10-CM

## 2022-04-11 NOTE — Patient Instructions (Signed)
Medication Instructions:   Your physician recommends that you continue on your current medications as directed. Please refer to the Current Medication list given to you today.  *If you need a refill on your cardiac medications before your next appointment, please call your pharmacy*   Follow-Up:  AS NEEDED WITH DR. Shari Prows HERE AT [facility]    Important Information About Sugar

## 2022-05-26 ENCOUNTER — Ambulatory Visit: Payer: 59 | Admitting: Podiatry

## 2022-05-29 ENCOUNTER — Ambulatory Visit: Payer: 59 | Admitting: Podiatry

## 2022-06-06 ENCOUNTER — Ambulatory Visit (INDEPENDENT_AMBULATORY_CARE_PROVIDER_SITE_OTHER): Payer: 59

## 2022-06-06 ENCOUNTER — Ambulatory Visit (INDEPENDENT_AMBULATORY_CARE_PROVIDER_SITE_OTHER): Payer: 59 | Admitting: Podiatry

## 2022-06-06 DIAGNOSIS — M21611 Bunion of right foot: Secondary | ICD-10-CM

## 2022-06-06 DIAGNOSIS — Z01818 Encounter for other preprocedural examination: Secondary | ICD-10-CM

## 2022-06-06 DIAGNOSIS — M21619 Bunion of unspecified foot: Secondary | ICD-10-CM

## 2022-06-06 NOTE — Progress Notes (Signed)
Subjective:  Patient ID: Mary Mccall, female    DOB: 1985-10-20,  MRN: [id number]  Chief Complaint  Patient presents with   Bunions    Rt foot     36 y.o. female presents with the above complaint.  Patient presents with right bunion deformity.  Patient had surgery done in the past may have been just a silver ectomy.  Patient states the bunion started coming back is moderate in nature has not seen anyone as prior to seeing me she is tried conservative treatment care include shoe gear modification padding protecting orthotics would like to discuss surgical options at this time.  She has not seen anyone as prior to seeing me.  She denies any other acute complaints  Review of Systems: Negative except as noted in the HPI. Denies N/V/F/Ch.  No past medical history on file.  Current Outpatient Medications:    acetaminophen (TYLENOL) 500 MG tablet, Take 2 tablets (1,000 mg total) by mouth every 6 (six) hours. (Patient not taking: Reported on 04/11/2022), Disp: 30 tablet, Rfl: 0   coconut oil OIL, Apply 1 application. topically as needed. (Patient not taking: Reported on 04/11/2022), Disp: , Rfl: 0   furosemide (LASIX) 20 MG tablet, Take 1 tablet (20 mg total) by mouth daily for 12 days., Disp: 12 tablet, Rfl: 0   ibuprofen (ADVIL) 600 MG tablet, Take 1 tablet (600 mg total) by mouth every 6 (six) hours. (Patient not taking: Reported on 04/11/2022), Disp: 30 tablet, Rfl: 0   iron polysaccharides (FERREX 150) 150 MG capsule, Take 1 capsule (150 mg total) by mouth daily. (Patient not taking: Reported on 04/11/2022), Disp: , Rfl:    magnesium oxide (MAG-OX) 400 (240 Mg) MG tablet, Take 1 tablet (400 mg total) by mouth daily. For prevention of constipation. (Patient not taking: Reported on 04/11/2022), Disp: 30 tablet, Rfl:    NIFEdipine (ADALAT CC) 30 MG 24 hr tablet, Take 1 tablet (30 mg total) by mouth daily., Disp: 30 tablet, Rfl: 0   Prenatal Vit-Fe Fumarate-FA (PRENATAL MULTIVITAMIN) TABS tablet,  Take 1 tablet by mouth daily at 12 noon., Disp: , Rfl:    senna-docusate (SENOKOT-S) 8.6-50 MG tablet, Take 2 tablets by mouth daily. (Patient not taking: Reported on 04/11/2022), Disp: , Rfl:   Social History   Tobacco Use  Smoking Status Never  Smokeless Tobacco Never    No Known Allergies Objective:  There were no vitals filed for this visit. There is no height or weight on file to calculate BMI. Constitutional Well developed. Well nourished.  Vascular Dorsalis pedis pulses palpable bilaterally. Posterior tibial pulses palpable bilaterally. Capillary refill normal to all digits.  No cyanosis or clubbing noted. Pedal hair growth normal.  Neurologic Normal speech. Oriented to person, place, and time. Epicritic sensation to light touch grossly present bilaterally.  Dermatologic Nails well groomed and normal in appearance. No open wounds. No skin lesions.  Orthopedic: Normal joint ROM without pain or crepitus bilaterally. Hallux abductovalgus deformity present Left 1st MPJ diminished range of motion. Left 1st TMT without gross hypermobility. Right 1st MPJ diminished range of motion  Right 1st TMT without gross hypermobility. Lesser digital contractures absent bilaterally.   Radiographs: Taken and reviewed. Hallux abductovalgus deformity present. Metatarsal parabola normal. 1st/2nd IMA: Moderate; TSP: 5 out of 7 right  Assessment:   1. Bunion   2. Encounter for preoperative examination for general surgical procedure    Plan:  Patient was evaluated and treated and all questions answered.  Hallux abductovalgus deformity, right -  XR as above. -Patient has failed all conservative therapy and wishes to proceed with surgical intervention. All risks, benefits, and alternatives discussed with patient. No guarantees given. Consent reviewed and signed by patient. Post-op course explained at length. -Planned procedures: Chevron osteotomy with a possible phalangeal osteotomy.   History of bunion surgery in [place] -Risk factors: None  No follow-ups on file.   No follow-ups on file.   Right bunionetomy chevron CAM boot.  History of bunion surgery done in 2015 in [place] on the right foot

## 2022-06-25 ENCOUNTER — Telehealth: Payer: Self-pay | Admitting: Urology

## 2022-06-25 NOTE — Telephone Encounter (Signed)
DOS - 07/28/22  DOUBLE OSTEOTOMY RIGHT --- [address] [place] RIGHT --- [address] OSTEOTOM[address] RIGHT --- [id number]  [employer] EFFECTIVE DATE 09/29/21  PLAN DEDUCTIBLE - $3,600.00 OUT OF POCKET - $8,000.00 COINSURANCE - 20% COPAY - $0.00   SPOKE WITH SANDRA WITH [facility] [employer] AND SHE STATED THAT FOR CPT CODES 74944, [id number] AND [id number] NO PRIOR AUTH IS REQUIRED.  REF # SANDRA E. 06/25/22 AT 2:47 PM EST

## 2022-06-27 ENCOUNTER — Telehealth: Payer: Self-pay

## 2022-06-27 NOTE — Telephone Encounter (Signed)
Mary Mccall called to cancel her surgery with Dr. Posey Pronto on 07/28/2022. She stated she doesn't want to have surgery at this time. She will call if she decides to proceed. Notified Dr. Posey Pronto and Caren Griffins with [facility]

## 2022-08-06 ENCOUNTER — Encounter: Payer: 59 | Admitting: Podiatry

## 2022-08-20 ENCOUNTER — Encounter: Payer: 59 | Admitting: Podiatry

## 2023-06-11 LAB — OB RESULTS CONSOLE HIV ANTIBODY (ROUTINE TESTING): HIV: NONREACTIVE

## 2023-06-11 LAB — OB RESULTS CONSOLE RPR: RPR: NONREACTIVE

## 2023-06-11 LAB — OB RESULTS CONSOLE HEPATITIS B SURFACE ANTIGEN: Hepatitis B Surface Ag: NEGATIVE

## 2023-06-11 LAB — OB RESULTS CONSOLE ANTIBODY SCREEN: Antibody Screen: NEGATIVE

## 2023-06-11 LAB — OB RESULTS CONSOLE RUBELLA ANTIBODY, IGM: Rubella: IMMUNE

## 2023-07-01 LAB — OB RESULTS CONSOLE GC/CHLAMYDIA
Chlamydia: NEGATIVE
Neisseria Gonorrhea: NEGATIVE

## 2023-07-28 IMAGING — CR DG CHEST 2V
2 series · 2 of 2 positions shown · non-contrast
Comparison: Chest x-ray 03/08/2022.

CLINICAL DATA: 35-year-old female with history of shortness of
breath.

EXAM:
CHEST - 2 VIEW

[chest pa]
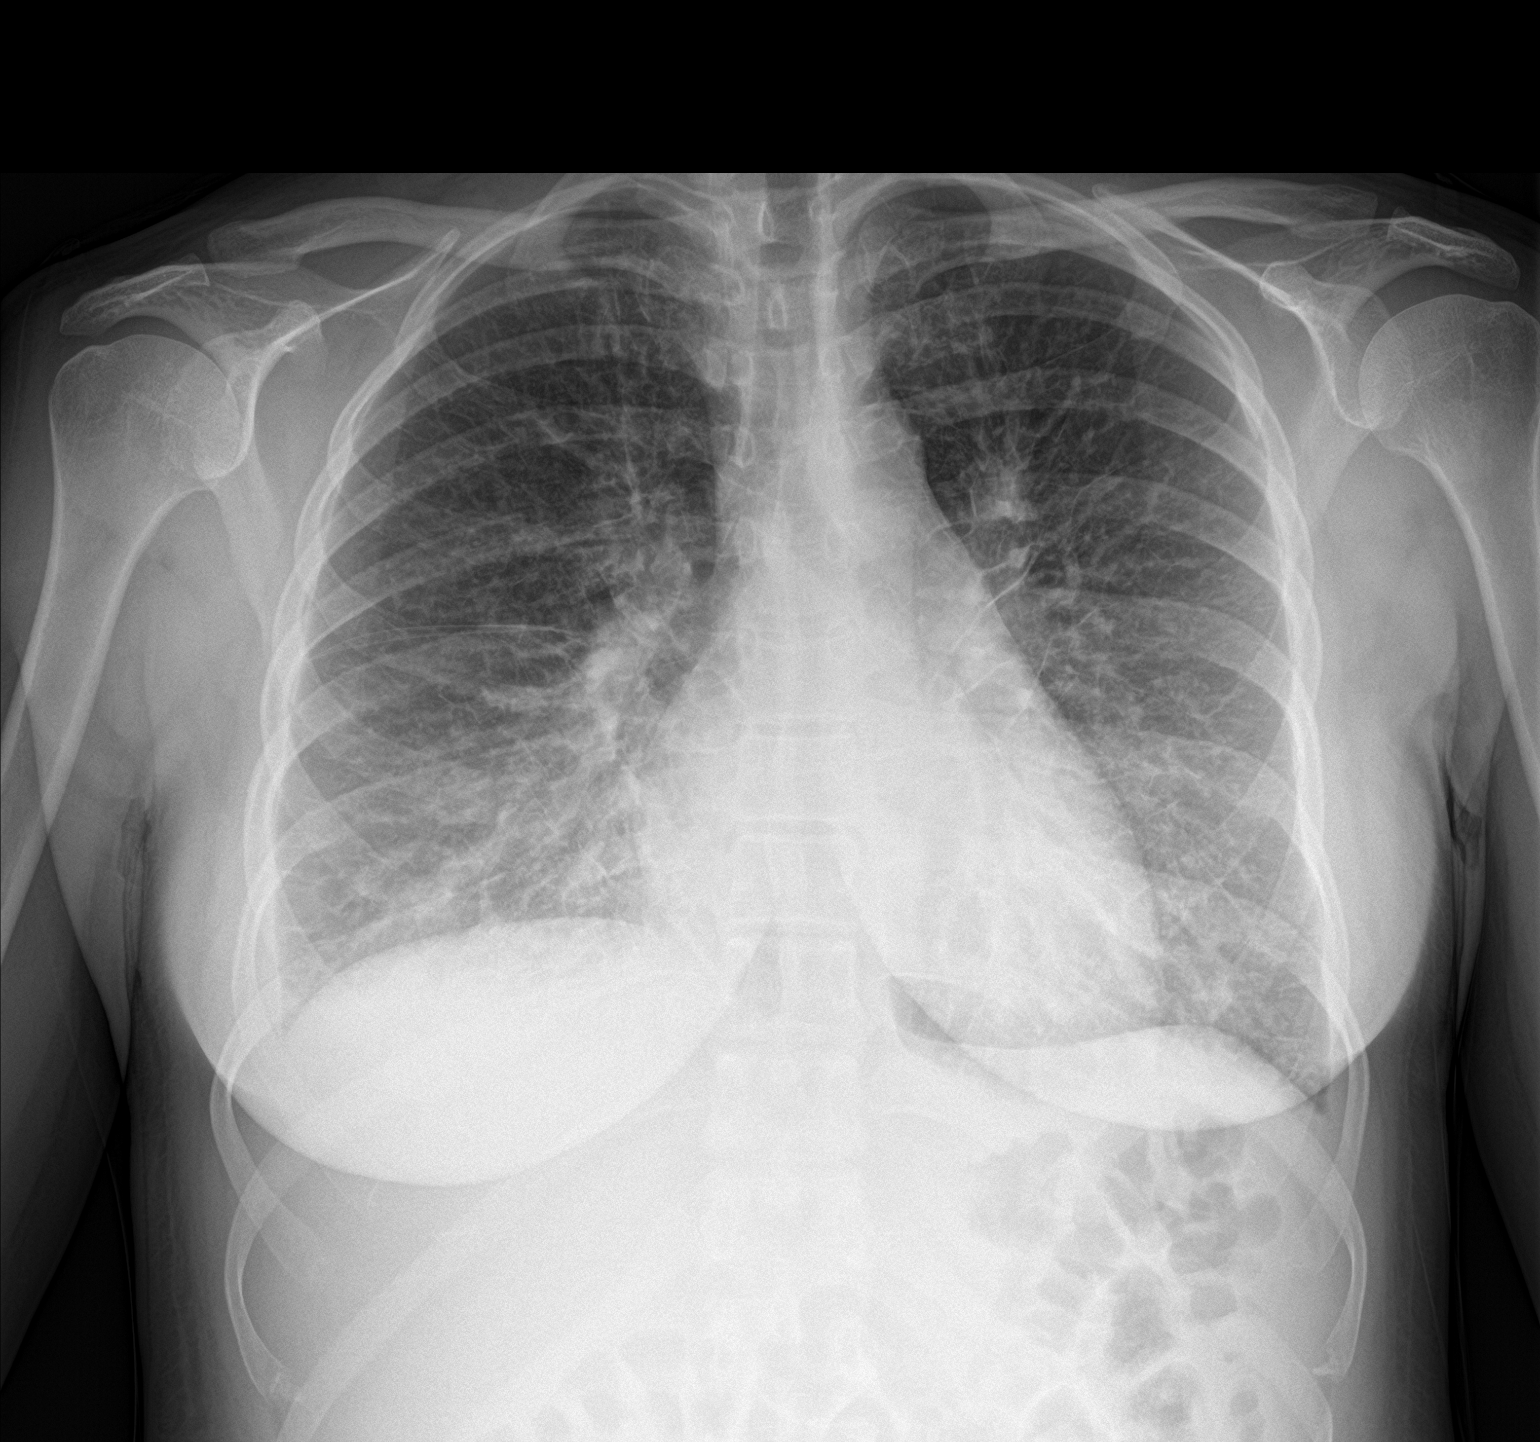

[chest lat]
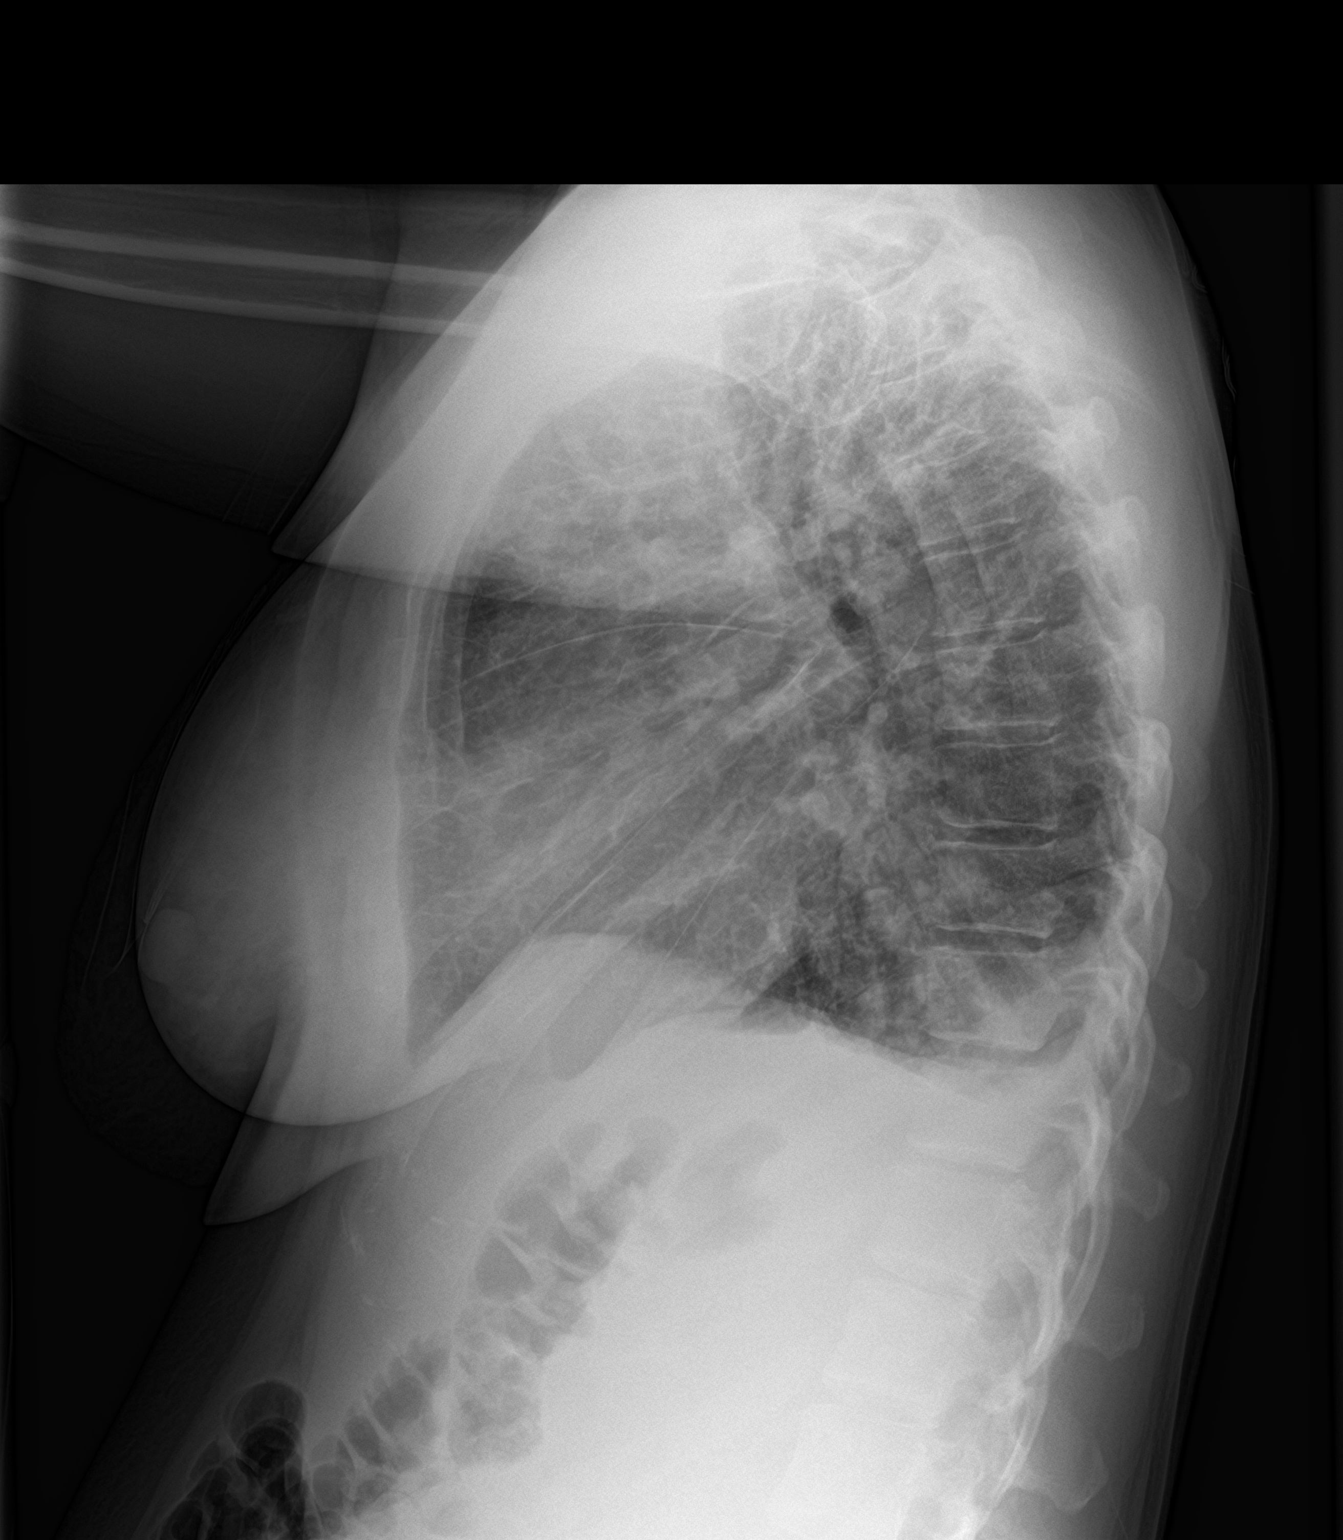

[2 of 2 positions shown; findings below may reference images not displayed]

FINDINGS: Lung volumes are normal. Diffuse peribronchial cuffing and
interstitial prominence throughout the mid to lower lungs
bilaterally with some focal airspace consolidation in the posterior
aspects of the right lower lobe. Blunting of both costophrenic sulci
suggesting trace bilateral pleural effusions. No pneumothorax. No
cephalization of the pulmonary vasculature. Heart size is normal.
Upper mediastinal contours are within normal limits.
IMPRESSION: 1. The appearance of the chest could suggest atypical multilobar
bilateral pneumonia or noncardiogenic pulmonary edema. Overall,
aeration has slightly improved compared to the prior study in the
lung bases.
2. Small bilateral pleural effusions.

## 2023-11-12 ENCOUNTER — Other Ambulatory Visit: Payer: Self-pay | Admitting: Obstetrics and Gynecology

## 2023-12-02 LAB — OB RESULTS CONSOLE GBS: GBS: NEGATIVE

## 2023-12-15 ENCOUNTER — Encounter (HOSPITAL_COMMUNITY): Payer: Self-pay | Admitting: *Deleted

## 2023-12-15 ENCOUNTER — Telehealth (HOSPITAL_COMMUNITY): Payer: Self-pay | Admitting: *Deleted

## 2023-12-15 NOTE — Telephone Encounter (Signed)
 Preadmission screen

## 2023-12-16 ENCOUNTER — Telehealth (HOSPITAL_COMMUNITY): Payer: Self-pay | Admitting: *Deleted

## 2023-12-16 NOTE — Telephone Encounter (Signed)
 Preadmission screen

## 2023-12-17 ENCOUNTER — Telehealth (HOSPITAL_COMMUNITY): Payer: Self-pay | Admitting: *Deleted

## 2023-12-17 NOTE — Telephone Encounter (Signed)
 Preadmission screen

## 2023-12-18 ENCOUNTER — Encounter (HOSPITAL_COMMUNITY): Payer: Self-pay

## 2023-12-18 NOTE — Patient Instructions (Addendum)
 Mary Mccall  12/18/2023   Your procedure is scheduled on:  12/28/2023  Arrive at 1000 at Entrance C on [address] at [facility]  and [employer]. You are invited to use the FREE valet parking or use the Visitor's parking deck.  Pick up the phone at the desk and dial [phone].  Call this number if you have problems the morning of surgery: [phone]  Remember:   Do not eat food:(After Midnight) Desps de medianoche.  You may drink clear liquids until arrival at __1000___.  Clear liquids means a liquid you can see thru.  It can have color such as Cola or Kool aid.  Tea is OK and coffee as long as no milk or creamer of any kind.  Take these medicines the morning of surgery with A SIP OF WATER:  none   Do not wear jewelry, make-up or nail polish.  Do not wear lotions, powders, or perfumes. Do not wear deodorant.  Do not shave 48 hours prior to surgery.  Do not bring valuables to the hospital.  [facility] is not   responsible for any belongings or valuables brought to the hospital.  Contacts, dentures or bridgework may not be worn into surgery.  Leave suitcase in the car. After surgery it may be brought to your room.  For patients admitted to the hospital, checkout time is 11:00 AM the day of              discharge.      Please read over the following fact sheets that you were given:     Preparing for Surgery

## 2023-12-25 ENCOUNTER — Encounter (HOSPITAL_COMMUNITY)
Admission: RE | Admit: 2023-12-25 | Discharge: 2023-12-25 | Disposition: A | Discharge status: Home / Self Care | Source: Ambulatory Visit | Attending: Obstetrics and Gynecology | Admitting: Obstetrics and Gynecology

## 2023-12-25 DIAGNOSIS — Z01812 Encounter for preprocedural laboratory examination: Secondary | ICD-10-CM | POA: Insufficient documentation

## 2023-12-25 HISTORY — DX: Supervision of pregnancy with other poor reproductive or obstetric history, unspecified trimester: O09.299

## 2023-12-25 HISTORY — DX: Other complications of anesthesia, initial encounter: T88.59XA

## 2023-12-25 LAB — RPR: RPR Ser Ql: NONREACTIVE

## 2023-12-25 LAB — CBC
HCT: 39.2 % (ref 36.0–46.0)
Hemoglobin: 12.8 g/dL (ref 12.0–15.0)
MCH: 30 pg (ref 26.0–34.0)
MCHC: 32.7 g/dL (ref 30.0–36.0)
MCV: 91.8 fL (ref 80.0–100.0)
Platelets: 197 10*3/uL (ref 150–400)
RBC: 4.27 MIL/uL (ref 3.87–5.11)
RDW: 13.3 % (ref 11.5–15.5)
WBC: 11.7 10*3/uL — ABNORMAL HIGH (ref 4.0–10.5)
nRBC: 0 % (ref 0.0–0.2)

## 2023-12-25 LAB — TYPE AND SCREEN
ABO/RH(D): A POS
Antibody Screen: NEGATIVE

## 2023-12-27 NOTE — H&P (Signed)
 Mary Mccall is a 38 y.o. female presenting for rpt csection at 39wks. OB History     Gravida  2   Para  1   Term  1   Preterm      AB      Living  1      SAB      IAB      Ectopic      Multiple  0   Live Births  1          Past Medical History:  Diagnosis Date   Complication of anesthesia    low BP past spinal and low temp   History of pre-eclampsia in prior pregnancy, currently pregnant    Past Surgical History:  Procedure Laterality Date   CESAREAN SECTION N/A 03/04/2022   Procedure: Primary CESAREAN SECTION;  Surgeon: Toy Baker, DO;  Location: [facility];  Service: Obstetrics;  Laterality: N/A;  EDD: 03/09/22   FOOT SURGERY     Family History: family history is not on file. Social History:  reports that she has never smoked. She has never used smokeless tobacco. She reports that she does not drink alcohol and does not use drugs.     Maternal Diabetes: No Genetic Screening: Normal Maternal Ultrasounds/Referrals: Normal Fetal Ultrasounds or other Referrals:  None Maternal Substance Abuse:  No Significant Maternal Medications:  None Significant Maternal Lab Results:  Group B Strep negative Number of Prenatal Visits:greater than 3 verified prenatal visits Maternal Vaccinations:TDap Other Comments:  None  Review of Systems  Constitutional: Negative.   All other systems reviewed and are negative.  Maternal Medical History:  Reason for admission: Contractions.   Contractions: Frequency: irregular.   Perceived severity is mild.   Fetal activity: Perceived fetal activity is normal.   Last perceived fetal movement was within the past hour.   Prenatal complications: no prenatal complications Prenatal Complications - Diabetes: none.     currently breastfeeding. Maternal Exam:  Uterine Assessment: Contraction strength is mild.  Contraction frequency is rare.  Abdomen: Patient reports no abdominal tenderness. Surgical scars: low transverse.    Fetal presentation: vertex Introitus: Normal vulva. Normal vagina.  Ferning test: not done.  Nitrazine test: not done. Amniotic fluid character: not assessed. Pelvis: questionable for delivery.     Physical Exam Nursing note reviewed.  Constitutional:      Appearance: Normal appearance. She is normal weight.  HENT:     Head: Normocephalic and atraumatic.  Cardiovascular:     Rate and Rhythm: Normal rate and regular rhythm.     Pulses: Normal pulses.     Heart sounds: Normal heart sounds.  Pulmonary:     Effort: Pulmonary effort is normal.     Breath sounds: Normal breath sounds.  Abdominal:     General: Bowel sounds are normal.     Palpations: Abdomen is soft.  Genitourinary:    General: Normal vulva.  Musculoskeletal:        General: Normal range of motion.     Cervical back: Normal range of motion and neck supple.  Skin:    General: Skin is warm and dry.  Neurological:     General: No focal deficit present.     Mental Status: She is alert and oriented to person, place, and time.  Psychiatric:        Mood and Affect: Mood normal.        Behavior: Behavior normal.     Prenatal labs: ABO, Rh: --/--/A POS (  03/28 0935) Antibody: NEG (03/28 0935) Rubella: Immune (09/12 0000) RPR: NON REACTIVE (03/28 0924)  HBsAg: Negative (09/12 0000)  HIV: Non-reactive (09/12 0000)  GBS: Negative/-- (03/05 0000)   Assessment/Plan: 39 wk IUP Previous csection for rpt. Surgical risks discussed. Risks of anesthesia, infection, bleeding ,injury to surrounding organs with need for repair discussed.Consent done.   Mary Mccall J 12/27/2023, 8:50 PM

## 2023-12-28 ENCOUNTER — Other Ambulatory Visit: Payer: Self-pay

## 2023-12-28 ENCOUNTER — Inpatient Hospital Stay (HOSPITAL_COMMUNITY): Admitting: Anesthesiology

## 2023-12-28 ENCOUNTER — Encounter (HOSPITAL_COMMUNITY)
Admission: AD | Disposition: A | Discharge status: Home / Self Care | Payer: Self-pay | Source: Home / Self Care | Attending: Obstetrics and Gynecology

## 2023-12-28 ENCOUNTER — Encounter (HOSPITAL_COMMUNITY): Payer: Self-pay | Admitting: Obstetrics and Gynecology

## 2023-12-28 ENCOUNTER — Inpatient Hospital Stay (HOSPITAL_COMMUNITY)
Admission: AD | Admit: 2023-12-28 | Discharge: 2023-12-30 | DRG: 788 | Disposition: A | Discharge status: Home / Self Care | Payer: 59 | Attending: Obstetrics and Gynecology | Admitting: Obstetrics and Gynecology

## 2023-12-28 DIAGNOSIS — Z98891 History of uterine scar from previous surgery: Secondary | ICD-10-CM

## 2023-12-28 DIAGNOSIS — Z3490 Encounter for supervision of normal pregnancy, unspecified, unspecified trimester: Secondary | ICD-10-CM

## 2023-12-28 DIAGNOSIS — O34211 Maternal care for low transverse scar from previous cesarean delivery: Principal | ICD-10-CM | POA: Diagnosis present

## 2023-12-28 DIAGNOSIS — O9081 Anemia of the puerperium: Secondary | ICD-10-CM | POA: Diagnosis not present

## 2023-12-28 DIAGNOSIS — Z3A39 39 weeks gestation of pregnancy: Secondary | ICD-10-CM

## 2023-12-28 DIAGNOSIS — D509 Iron deficiency anemia, unspecified: Secondary | ICD-10-CM | POA: Diagnosis not present

## 2023-12-28 SURGERY — Surgical Case
Anesthesia: Spinal

## 2023-12-28 MED ORDER — SIMETHICONE 80 MG PO CHEW: 80.0000 mg | CHEWABLE_TABLET | ORAL | Status: DC | PRN

## 2023-12-28 MED ORDER — OXYCODONE HCL 5 MG/5ML PO SOLN: 5.0000 mg | Freq: Once | ORAL | Status: DC | PRN

## 2023-12-28 MED ORDER — OXYCODONE HCL 5 MG PO TABS: 5.0000 mg | ORAL_TABLET | Freq: Once | ORAL | Status: DC | PRN

## 2023-12-28 MED ORDER — KETOROLAC TROMETHAMINE 30 MG/ML IJ SOLN
30.0000 mg | Freq: Once | INTRAMUSCULAR | Status: AC | PRN
  Administered 2023-12-28: 30 mg via INTRAVENOUS

## 2023-12-28 MED ORDER — LACTATED RINGERS IV SOLN: INTRAVENOUS | Status: DC

## 2023-12-28 MED ORDER — KETOROLAC TROMETHAMINE 30 MG/ML IJ SOLN
30.0000 mg | Freq: Four times a day (QID) | INTRAMUSCULAR | Status: AC
Start: 2023-12-28 — End: 2023-12-29
  Administered 2023-12-28 – 2023-12-29 (×2): 30 mg via INTRAVENOUS
  Filled 2023-12-28 (×2): qty 1

## 2023-12-28 MED ORDER — ACETAMINOPHEN 10 MG/ML IV SOLN
INTRAVENOUS | Status: DC | PRN
  Administered 2023-12-28: 1000 mg via INTRAVENOUS

## 2023-12-28 MED ORDER — FENTANYL CITRATE (PF) 100 MCG/2ML IJ SOLN
INTRAMUSCULAR | Status: DC | PRN
  Administered 2023-12-28: 15 ug via INTRATHECAL

## 2023-12-28 MED ORDER — OXYCODONE HCL 5 MG PO TABS: 5.0000 mg | ORAL_TABLET | ORAL | Status: DC | PRN

## 2023-12-28 MED ORDER — POVIDONE-IODINE 10 % EX SWAB: 2.0000 | Freq: Once | CUTANEOUS | Status: DC

## 2023-12-28 MED ORDER — SODIUM CHLORIDE 0.9% FLUSH: 3.0000 mL | INTRAVENOUS | Status: DC | PRN

## 2023-12-28 MED ORDER — METHYLERGONOVINE MALEATE 0.2 MG PO TABS: 0.2000 mg | ORAL_TABLET | ORAL | Status: DC | PRN

## 2023-12-28 MED ORDER — WITCH HAZEL-GLYCERIN EX PADS: 1.0000 | MEDICATED_PAD | CUTANEOUS | Status: DC | PRN

## 2023-12-28 MED ORDER — BUPIVACAINE HCL 0.25 % IJ SOLN
INTRAMUSCULAR | Status: DC | PRN
  Administered 2023-12-28: 10 mL

## 2023-12-28 MED ORDER — PRENATAL MULTIVITAMIN CH
1.0000 | ORAL_TABLET | Freq: Every day | ORAL | Status: DC
  Administered 2023-12-29 – 2023-12-30 (×2): 1 via ORAL
  Filled 2023-12-28 (×2): qty 1

## 2023-12-28 MED ORDER — NALOXONE HCL 0.4 MG/ML IJ SOLN: 0.4000 mg | INTRAMUSCULAR | Status: DC | PRN

## 2023-12-28 MED ORDER — DEXAMETHASONE SODIUM PHOSPHATE 10 MG/ML IJ SOLN
INTRAMUSCULAR | Status: DC | PRN
  Administered 2023-12-28: 10 mg via INTRAVENOUS

## 2023-12-28 MED ORDER — SIMETHICONE 80 MG PO CHEW
80.0000 mg | CHEWABLE_TABLET | Freq: Three times a day (TID) | ORAL | Status: DC
  Administered 2023-12-28 – 2023-12-30 (×5): 80 mg via ORAL
  Filled 2023-12-28 (×5): qty 1

## 2023-12-28 MED ORDER — LACTATED RINGERS IV SOLN: INTRAVENOUS | Status: AC

## 2023-12-28 MED ORDER — ONDANSETRON HCL 4 MG/2ML IJ SOLN
INTRAMUSCULAR | Status: AC
  Filled 2023-12-28: qty 2

## 2023-12-28 MED ORDER — PHENYLEPHRINE HCL-NACL 20-0.9 MG/250ML-% IV SOLN
INTRAVENOUS | Status: AC
Start: 2023-12-28 — End: ?
  Filled 2023-12-28: qty 250

## 2023-12-28 MED ORDER — OXYTOCIN-SODIUM CHLORIDE 30-0.9 UT/500ML-% IV SOLN
INTRAVENOUS | Status: DC | PRN
  Administered 2023-12-28: 300 mL via INTRAVENOUS

## 2023-12-28 MED ORDER — MENTHOL 3 MG MT LOZG: 1.0000 | LOZENGE | OROMUCOSAL | Status: DC | PRN

## 2023-12-28 MED ORDER — MORPHINE SULFATE (PF) 0.5 MG/ML IJ SOLN
INTRAMUSCULAR | Status: AC
  Filled 2023-12-28: qty 10

## 2023-12-28 MED ORDER — CEFAZOLIN SODIUM-DEXTROSE 2-4 GM/100ML-% IV SOLN
2.0000 g | INTRAVENOUS | Status: AC
  Administered 2023-12-28: 2 g via INTRAVENOUS

## 2023-12-28 MED ORDER — NALOXONE HCL 4 MG/10ML IJ SOLN: 1.0000 ug/kg/h | INTRAVENOUS | Status: DC | PRN

## 2023-12-28 MED ORDER — BUPIVACAINE IN DEXTROSE 0.75-8.25 % IT SOLN
INTRATHECAL | Status: DC | PRN
  Administered 2023-12-28: 1.6 mL via INTRATHECAL

## 2023-12-28 MED ORDER — MEPERIDINE HCL 25 MG/ML IJ SOLN: 6.2500 mg | INTRAMUSCULAR | Status: DC | PRN

## 2023-12-28 MED ORDER — CEFAZOLIN SODIUM-DEXTROSE 2-4 GM/100ML-% IV SOLN
INTRAVENOUS | Status: AC
  Filled 2023-12-28: qty 100

## 2023-12-28 MED ORDER — MORPHINE SULFATE (PF) 0.5 MG/ML IJ SOLN
INTRAMUSCULAR | Status: DC | PRN
  Administered 2023-12-28: 150 ug via INTRATHECAL

## 2023-12-28 MED ORDER — STERILE WATER FOR IRRIGATION IR SOLN
Status: DC | PRN
  Administered 2023-12-28: 1000 mL

## 2023-12-28 MED ORDER — BUPIVACAINE HCL (PF) 0.25 % IJ SOLN
INTRAMUSCULAR | Status: AC
  Filled 2023-12-28: qty 10

## 2023-12-28 MED ORDER — SODIUM CHLORIDE 0.9 % IV SOLN: 12.5000 mg | INTRAVENOUS | Status: DC | PRN

## 2023-12-28 MED ORDER — IBUPROFEN 600 MG PO TABS
600.0000 mg | ORAL_TABLET | Freq: Four times a day (QID) | ORAL | Status: DC
  Administered 2023-12-28 – 2023-12-30 (×6): 600 mg via ORAL
  Filled 2023-12-28 (×6): qty 1

## 2023-12-28 MED ORDER — ZOLPIDEM TARTRATE 5 MG PO TABS: 5.0000 mg | ORAL_TABLET | Freq: Every evening | ORAL | Status: DC | PRN

## 2023-12-28 MED ORDER — OXYTOCIN-SODIUM CHLORIDE 30-0.9 UT/500ML-% IV SOLN
INTRAVENOUS | Status: AC
  Filled 2023-12-28: qty 500

## 2023-12-28 MED ORDER — ACETAMINOPHEN 500 MG PO TABS
1000.0000 mg | ORAL_TABLET | Freq: Four times a day (QID) | ORAL | Status: DC
  Administered 2023-12-28 – 2023-12-30 (×8): 1000 mg via ORAL
  Filled 2023-12-28 (×8): qty 2

## 2023-12-28 MED ORDER — DIBUCAINE (PERIANAL) 1 % EX OINT: 1.0000 | TOPICAL_OINTMENT | CUTANEOUS | Status: DC | PRN

## 2023-12-28 MED ORDER — DIPHENHYDRAMINE HCL 25 MG PO CAPS: 25.0000 mg | ORAL_CAPSULE | Freq: Four times a day (QID) | ORAL | Status: DC | PRN

## 2023-12-28 MED ORDER — METHYLERGONOVINE MALEATE 0.2 MG/ML IJ SOLN: 0.2000 mg | INTRAMUSCULAR | Status: DC | PRN

## 2023-12-28 MED ORDER — ONDANSETRON HCL 4 MG/2ML IJ SOLN
INTRAMUSCULAR | Status: DC | PRN
  Administered 2023-12-28: 4 mg via INTRAVENOUS

## 2023-12-28 MED ORDER — DIPHENHYDRAMINE HCL 50 MG/ML IJ SOLN: 12.5000 mg | INTRAMUSCULAR | Status: DC | PRN

## 2023-12-28 MED ORDER — DIPHENHYDRAMINE HCL 25 MG PO CAPS: 25.0000 mg | ORAL_CAPSULE | ORAL | Status: DC | PRN

## 2023-12-28 MED ORDER — HYDROMORPHONE HCL 1 MG/ML IJ SOLN: 0.2500 mg | INTRAMUSCULAR | Status: DC | PRN

## 2023-12-28 MED ORDER — PHENYLEPHRINE HCL-NACL 20-0.9 MG/250ML-% IV SOLN
INTRAVENOUS | Status: DC | PRN
  Administered 2023-12-28: 60 ug/min via INTRAVENOUS

## 2023-12-28 MED ORDER — COCONUT OIL OIL: 1.0000 | TOPICAL_OIL | Status: DC | PRN

## 2023-12-28 MED ORDER — KETOROLAC TROMETHAMINE 30 MG/ML IJ SOLN
INTRAMUSCULAR | Status: AC
  Filled 2023-12-28: qty 1

## 2023-12-28 MED ORDER — ACETAMINOPHEN 10 MG/ML IV SOLN
INTRAVENOUS | Status: AC
  Filled 2023-12-28: qty 100

## 2023-12-28 MED ORDER — OXYTOCIN-SODIUM CHLORIDE 30-0.9 UT/500ML-% IV SOLN: 2.5000 [IU]/h | INTRAVENOUS | Status: AC

## 2023-12-28 MED ORDER — FENTANYL CITRATE (PF) 100 MCG/2ML IJ SOLN
INTRAMUSCULAR | Status: AC
  Filled 2023-12-28: qty 2

## 2023-12-28 MED ORDER — SENNOSIDES-DOCUSATE SODIUM 8.6-50 MG PO TABS
2.0000 | ORAL_TABLET | Freq: Every day | ORAL | Status: DC
  Administered 2023-12-29 – 2023-12-30 (×2): 2 via ORAL
  Filled 2023-12-28 (×2): qty 2

## 2023-12-28 MED ORDER — DEXAMETHASONE SODIUM PHOSPHATE 10 MG/ML IJ SOLN
INTRAMUSCULAR | Status: AC
  Filled 2023-12-28: qty 1

## 2023-12-28 MED ORDER — LACTATED RINGERS IV BOLUS
500.0000 mL | Freq: Once | INTRAVENOUS | Status: AC
  Administered 2023-12-28: 500 mL via INTRAVENOUS

## 2023-12-28 SURGICAL SUPPLY — 34 items
BENZOIN TINCTURE PRP APPL 2/3 (GAUZE/BANDAGES/DRESSINGS) IMPLANT
CHLORAPREP W/TINT 26 (MISCELLANEOUS) ×2 IMPLANT
CLAMP UMBILICAL CORD (MISCELLANEOUS) ×1 IMPLANT
CLOTH BEACON ORANGE TIMEOUT ST (SAFETY) ×1 IMPLANT
DRSG OPSITE POSTOP 4X10 (GAUZE/BANDAGES/DRESSINGS) ×1 IMPLANT
ELECT REM PT RETURN 9FT ADLT (ELECTROSURGICAL) ×1 IMPLANT
ELECTRODE REM PT RTRN 9FT ADLT (ELECTROSURGICAL) ×1 IMPLANT
EXTRACTOR VACUUM KIWI (MISCELLANEOUS) IMPLANT
GLOVE BIOGEL PI IND STRL 7.0 (GLOVE) ×1 IMPLANT
GLOVE SURG LTX SZ8 (GLOVE) ×1 IMPLANT
GOWN STRL REUS W/TWL LRG LVL3 (GOWN DISPOSABLE) ×2 IMPLANT
KIT ABG SYR 3ML LUER SLIP (SYRINGE) IMPLANT
MAT PREVALON FULL STRYKER (MISCELLANEOUS) IMPLANT
NDL HYPO 25X5/8 SAFETYGLIDE (NEEDLE) IMPLANT
NDL SPNL 20GX3.5 QUINCKE YW (NEEDLE) IMPLANT
NEEDLE HYPO 22GX1.5 SAFETY (NEEDLE) ×1 IMPLANT
NEEDLE HYPO 25X5/8 SAFETYGLIDE (NEEDLE) IMPLANT
NEEDLE SPNL 20GX3.5 QUINCKE YW (NEEDLE) IMPLANT
NS IRRIG 1000ML POUR BTL (IV SOLUTION) ×1 IMPLANT
PACK C SECTION WH (CUSTOM PROCEDURE TRAY) ×1 IMPLANT
STRIP CLOSURE SKIN 1/2X4 (GAUZE/BANDAGES/DRESSINGS) IMPLANT
SUT MNCRL 0 VIOLET CTX 36 (SUTURE) ×2 IMPLANT
SUT MNCRL AB 3-0 PS2 27 (SUTURE) IMPLANT
SUT MON AB 2-0 CT1 27 (SUTURE) ×1 IMPLANT
SUT MON AB-0 CT1 36 (SUTURE) ×2 IMPLANT
SUT PLAIN 0 NONE (SUTURE) IMPLANT
SUT PLAIN 2 0 XLH (SUTURE) IMPLANT
SUT PLAIN ABS 2-0 CT1 27XMFL (SUTURE) IMPLANT
SUT VIC AB 4-0 KS 27 (SUTURE) IMPLANT
SUTURE PLAIN GUT 2.0 ETHICON (SUTURE) IMPLANT
SYR CONTROL 10ML LL (SYRINGE) ×1 IMPLANT
TOWEL OR 17X24 6PK STRL BLUE (TOWEL DISPOSABLE) ×1 IMPLANT
TRAY FOLEY W/BAG SLVR 14FR LF (SET/KITS/TRAYS/PACK) ×1 IMPLANT
WATER STERILE IRR 1000ML POUR (IV SOLUTION) ×1 IMPLANT

## 2023-12-28 NOTE — Anesthesia Postprocedure Evaluation (Signed)
 Anesthesia Post Note  Patient: Mary Mccall  Procedure(s) Performed: REPEAT CESAREAN SECTION     Patient location during evaluation: PACU Anesthesia Type: Spinal Level of consciousness: awake and alert Pain management: pain level controlled Vital Signs Assessment: post-procedure vital signs reviewed and stable Respiratory status: spontaneous breathing, nonlabored ventilation and respiratory function stable Cardiovascular status: blood pressure returned to baseline and stable Postop Assessment: no apparent nausea or vomiting Anesthetic complications: no   No notable events documented.  Last Vitals:  Vitals:   12/28/23 1300 12/28/23 1332  BP: 108/72 102/63  Pulse: 74 74  Resp: 16 16  Temp: (!) 36.4 C 36.4 C  SpO2: 95% 97%    Last Pain:  Vitals:   12/28/23 1332  TempSrc: Oral  PainSc: 0-No pain   Pain Goal:    LLE Motor Response: Purposeful movement (12/28/23 1300) LLE Sensation: Tingling (12/28/23 1300) RLE Motor Response: Purposeful movement (12/28/23 1300) RLE Sensation: Tingling (12/28/23 1300)     Epidural/Spinal Function Cutaneous sensation: Able to Wiggle Toes (12/28/23 1332), Patient able to flex knees: Yes (12/28/23 1332), Patient able to lift hips off bed: No (12/28/23 1332), Back pain beyond tenderness at insertion site: No (12/28/23 1332), Progressively worsening motor and/or sensory loss: No (12/28/23 1332), Bowel and/or bladder incontinence post epidural: No (12/28/23 1332)  Lowella Curb

## 2023-12-28 NOTE — Anesthesia Preprocedure Evaluation (Signed)
 Anesthesia Evaluation  Patient identified by MRN, date of birth, ID band Patient awake    Reviewed: Allergy & Precautions, NPO status , Patient's Chart, lab work & pertinent test results  Airway Mallampati: II  TM Distance: >3 FB Neck ROM: Full    Dental no notable dental hx.    Pulmonary neg pulmonary ROS   Pulmonary exam normal breath sounds clear to auscultation       Cardiovascular negative cardio ROS Normal cardiovascular exam Rhythm:Regular Rate:Normal     Neuro/Psych negative neurological ROS  negative psych ROS   GI/Hepatic negative GI ROS, Neg liver ROS,,,  Endo/Other  negative endocrine ROS    Renal/GU negative Renal ROS  negative genitourinary   Musculoskeletal negative musculoskeletal ROS (+)    Abdominal   Peds negative pediatric ROS (+)  Hematology negative hematology ROS (+)   Anesthesia Other Findings   Reproductive/Obstetrics (+) Pregnancy                             Anesthesia Physical Anesthesia Plan  ASA: 2  Anesthesia Plan: Spinal   Post-op Pain Management:    Induction:   PONV Risk Score and Plan: 2 and Treatment may vary due to age or medical condition  Airway Management Planned: Natural Airway  Additional Equipment:   Intra-op Plan:   Post-operative Plan:   Informed Consent:   Plan Discussed with:   Anesthesia Plan Comments:         Anesthesia Quick Evaluation

## 2023-12-28 NOTE — Op Note (Signed)
 Cesarean Section Procedure Note  Indications: patient declines vag del attempt and previous uterine incision kerr x one  Pre-operative Diagnosis: 39 week 2 day pregnancy.  Post-operative Diagnosis: same  Surgeon: Lenoard Aden   Assistants: Yetta Barre, CNM  Anesthesia: Local anesthesia 0.25.% bupivacaine and Spinal anesthesia  ASA Class: 2  Procedure Details  The patient was seen in the Holding Room. The risks, benefits, complications, treatment options, and expected outcomes were discussed with the patient.  The patient concurred with the proposed plan, giving informed consent. The risks of anesthesia, infection, bleeding and possible injury to other organs discussed. Injury to bowel, bladder, or ureter with possible need for repair discussed. Possible need for transfusion with secondary risks of hepatitis or HIV acquisition discussed. Post operative complications to include but not limited to DVT, PE and Pneumonia noted. The site of surgery properly noted/marked. The patient was taken to Operating Room # C, identified as GIORGIA WAHLER and the procedure verified as C-Section Delivery. A Time Out was held and the above information confirmed.  After induction of anesthesia, the patient was draped and prepped in the usual sterile manner. A Pfannenstiel incision was made and carried down through the subcutaneous tissue to the fascia. Fascial incision was made and extended transversely using Mayo scissors. The fascia was separated from the underlying rectus tissue superiorly and inferiorly. The peritoneum was identified and entered. Peritoneal incision was extended longitudinally. The utero-vesical peritoneal reflection was incised transversely and the bladder flap was bluntly freed from the lower uterine segment. A low transverse uterine incision(Kerr hysterotomy) was made. Delivered from OT presentation was a  female with Apgar scores of 9 at one minute and 9 at five minutes. Bulb suctioning gently  performed. Neonatal team in attendance.After the umbilical cord was clamped and cut cord blood was obtained for evaluation. The placenta was removed intact and appeared normal. The uterus was curetted with a dry lap pack. Good hemostasis was noted.The uterine outline, tubes and ovaries appeared normal. The uterine incision was closed with running locked sutures of 0 Monocryl x 2 layers. Hemostasis was observed.  The fascia was then reapproximated with running sutures of 0 Monocryl. The skin was reapproximated with 4-0 vicryl after [place] closure with 2-0 plain.  Instrument, sponge, and needle counts were correct prior the abdominal closure and at the conclusion of the case.   Findings: FTLM, OT, anterior placenta, nl adnexa  Estimated Blood Loss:  300 mL         Drains: foley                 Specimens: placenta                 Complications:  None; patient tolerated the procedure well.         Disposition: PACU - hemodynamically stable.         Condition: stable  Attending Attestation: I performed the procedure.

## 2023-12-28 NOTE — Transfer of Care (Signed)
 Immediate Anesthesia Transfer of Care Note  Patient: Mary Mccall  Procedure(s) Performed: REPEAT CESAREAN SECTION  Patient Location: PACU  Anesthesia Type:Spinal  Level of Consciousness: awake  Airway & Oxygen Therapy: Patient Spontanous Breathing  Post-op Assessment: Report given to RN and Post -op Vital signs reviewed and stable  Post vital signs: Reviewed and stable  Last Vitals:  Vitals Value Taken Time  BP 107/63 12/28/23 1212  Temp    Pulse 75 12/28/23 1213  Resp 13 12/28/23 1213  SpO2 91 % 12/28/23 1213  Vitals shown include unfiled device data.  Last Pain:  Vitals:   12/28/23 0958  TempSrc: Oral         Complications: No notable events documented.

## 2023-12-28 NOTE — Anesthesia Procedure Notes (Signed)
 Spinal  Patient location during procedure: OB Start time: 12/28/2023 11:08 AM End time: 12/28/2023 11:13 AM Reason for block: surgical anesthesia Staffing Performed: anesthesiologist  Anesthesiologist: Lowella Curb, MD Performed by: Lowella Curb, MD Authorized by: Lowella Curb, MD   Preanesthetic Checklist Completed: patient identified, IV checked, risks and benefits discussed, surgical consent, monitors and equipment checked, pre-op evaluation and timeout performed Spinal Block Patient position: sitting Prep: DuraPrep and site prepped and draped Patient monitoring: heart rate, cardiac monitor, continuous pulse ox and blood pressure Approach: midline Location: L3-4 Injection technique: single-shot Needle Needle type: Pencan  Needle gauge: 24 G Needle length: 10 cm Assessment Sensory level: T4 Events: CSF return

## 2023-12-29 ENCOUNTER — Encounter (HOSPITAL_COMMUNITY): Payer: Self-pay | Admitting: Obstetrics and Gynecology

## 2023-12-29 LAB — CBC
HCT: 33.2 % — ABNORMAL LOW (ref 36.0–46.0)
Hemoglobin: 10.9 g/dL — ABNORMAL LOW (ref 12.0–15.0)
MCH: 29.9 pg (ref 26.0–34.0)
MCHC: 32.8 g/dL (ref 30.0–36.0)
MCV: 91 fL (ref 80.0–100.0)
Platelets: 163 10*3/uL (ref 150–400)
RBC: 3.65 MIL/uL — ABNORMAL LOW (ref 3.87–5.11)
RDW: 13.2 % (ref 11.5–15.5)
WBC: 17.4 10*3/uL — ABNORMAL HIGH (ref 4.0–10.5)
nRBC: 0 % (ref 0.0–0.2)

## 2023-12-29 NOTE — Progress Notes (Signed)
 Subjective: Postpartum Day 1: Cesarean Delivery Patient reports incisional pain, tolerating PO, + flatus, and no problems voiding.    Objective: Vital signs in last 24 hours: Temp:  [97.5 F (36.4 C)-98 F (36.7 C)] 98 F (36.7 C) (04/01 0523) Pulse Rate:  [70-89] 71 (04/01 0523) Resp:  [14-18] 18 (04/01 0523) BP: (102-113)/(61-82) 107/71 (04/01 0523) SpO2:  [94 %-100 %] 100 % (04/01 0523) Weight:  [81.8 kg] 81.8 kg (03/31 0950)  Physical Exam:  General: alert, cooperative, and appears stated age Lochia: appropriate Uterine Fundus: firm Incision: healing well, no significant drainage DVT Evaluation: No evidence of DVT seen on physical exam. No cords or calf tenderness.  Recent Labs    12/29/23 0446  HGB 10.9*  HCT 33.2*    Assessment/Plan: Status post Cesarean section. Doing well postoperatively.  Asymptomatic post op anemia Circumcision completed on newborn.  Likely dc in am Continue current care.  Lenoard Aden, MD 12/29/2023, 6:44 AM

## 2023-12-30 MED ORDER — ACETAMINOPHEN 500 MG PO TABS: 1000.0000 mg | ORAL_TABLET | Freq: Four times a day (QID) | ORAL | 0 refills | Status: AC

## 2023-12-30 MED ORDER — FUROSEMIDE 20 MG PO TABS: 10.0000 mg | ORAL_TABLET | Freq: Every day | ORAL | 0 refills | Status: AC

## 2023-12-30 MED ORDER — IBUPROFEN 600 MG PO TABS: 600.0000 mg | ORAL_TABLET | Freq: Four times a day (QID) | ORAL | 0 refills | Status: AC

## 2023-12-30 MED ORDER — PRENATAL MULTIVITAMIN CH: 1.0000 | ORAL_TABLET | Freq: Every day | ORAL | Status: AC

## 2023-12-30 MED ORDER — FUROSEMIDE 20 MG PO TABS
10.0000 mg | ORAL_TABLET | Freq: Every day | ORAL | Status: DC
  Administered 2023-12-30: 10 mg via ORAL
  Filled 2023-12-30: qty 1

## 2023-12-30 MED ORDER — SENNOSIDES-DOCUSATE SODIUM 8.6-50 MG PO TABS: 2.0000 | ORAL_TABLET | Freq: Every day | ORAL | Status: AC

## 2023-12-30 NOTE — Progress Notes (Signed)
 Subjective: Postpartum Day 2: elective repeat Cesarean Delivery Patient reports feeling well, no SOB/ CP/ fatigue/ LE swelling. NO n/v. Tolerating reg diet, ambulating, voiding. Motrin/ tylenol for pain, no narcotics.     Objective: Vital signs in last 24 hours: Temp:  [97.6 F (36.4 C)-99 F (37.2 C)] 98.4 F (36.9 C) (04/02 0541) Pulse Rate:  [75-84] 75 (04/02 0541) Resp:  [17-18] 18 (04/02 0541) BP: (106-123)/(75-77) 113/76 (04/02 0541) SpO2:  [99 %] 99 % (04/02 0541)  BP 113/76   Pulse 75   Temp 98.4 F (36.9 C) (Oral)   Resp 18   Ht 5\' 5"  (1.651 m)   Wt 81.8 kg   SpO2 99%   Breastfeeding Yes   BMI 30.02 kg/m   Physical Exam:  General: alert and cooperative Lungs CTA b/l CV RRR  Lochia: appropriate Uterine Fundus: firm Incision: no significant drainage DVT Evaluation: No evidence of DVT seen on physical exam. Negative Homan's sign.     Latest Ref Rng & Units 12/29/2023    4:46 AM 12/25/2023    9:24 AM 03/10/2022    5:18 AM  CBC  WBC 4.0 - 10.5 K/uL 17.4  11.7  7.5   Hemoglobin 12.0 - 15.0 g/dL 29.5  62.1  9.8   Hematocrit 36.0 - 46.0 % 33.2  39.2  29.9   Platelets 150 - 400 K/uL 163  197  245      Assessment/Plan: Status post Cesarean section. Doing well postoperatively.  Ok for discharge today. Post op care and pp care dw pt H/o PP PEC and pulm edema last delivery and readmit. Exam good now and no edema (unlike last preg). Plan low dose Laxix for 5 days and f/up in office next week w/ Dr Billy Coast, warning ss dw pt    Robley Fries, MD 12/30/2023, 10:00 AM

## 2023-12-30 NOTE — Discharge Summary (Signed)
 Postpartum Discharge Summary     Patient Name: Mary Mccall DOB: Apr 19, 1986 MRN: [id number]  Date of admission: 12/28/2023 Delivery date:12/28/2023 Delivering provider: Olivia Mackie Date of discharge: 12/30/2023  Admitting diagnosis: Pregnancy [Z34.90] Intrauterine pregnancy: [redacted]w[redacted]d     Secondary diagnosis:  Principal Problem:   Postpartum care following cesarean delivery 3/31 Active Problems:   Status post primary low transverse cesarean section 3/31   Previous cesarean section  Additional problems: none    Discharge diagnosis: Term Pregnancy Delivered                                              Post partum procedures:none  Augmentation: N/A Complications: None  Hospital course: Sceduled C/S   38 y.o. yo G2P2002 at [redacted]w[redacted]d was admitted to the hospital 12/28/2023 for scheduled cesarean section with the following indication:Elective Repeat.Delivery details are as follows:  Membrane Rupture Time/Date: 11:30 AM,12/28/2023  Delivery Method:C-Section, Low Transverse Operative Delivery:N/A Details of operation can be found in separate operative note.  Patient had a postpartum course complicated by none.  She is ambulating, tolerating a regular diet, passing flatus, and urinating well. Patient is discharged home in stable condition on  12/30/23        Newborn Data: Birth date:12/28/2023 Birth time:11:31 AM Gender:Female Living status:Living Apgars:9 ,9  Weight:4340 g    Magnesium Sulfate received: No BMZ received: No Rhophylac:N/A MMR:N/A T-DaP:Given prenatally Flu: N/A RSV Vaccine received: No Transfusion:No Immunizations administered: There is no immunization history for the selected administration types on file for this patient.  Physical exam  Vitals:   12/29/23 0523 12/29/23 1314 12/29/23 2101 12/30/23 0541  BP: 107/71 106/77 123/75 113/76  Pulse: 71 75 84 75  Resp: 18 17 18 18   Temp: 98 F (36.7 C) 99 F (37.2 C) 97.6 F (36.4 C) 98.4 F (36.9 C)   TempSrc: Oral Oral Oral Oral  SpO2: 100% 99%  99%  Weight:      Height:       General: alert, cooperative, and no distress Lochia: appropriate Lungs CTA b/l  CV RRR  Uterine Fundus: firm Incision: Dressing is clean, dry, and intact DVT Evaluation: No evidence of DVT seen on physical exam. Negative Homan's sign. No significant calf/ankle edema. Labs:    Latest Ref Rng & Units 12/29/2023    4:46 AM 12/25/2023    9:24 AM 03/10/2022    5:18 AM  CBC  WBC 4.0 - 10.5 K/uL 17.4  11.7  7.5   Hemoglobin 12.0 - 15.0 g/dL 40.9  81.1  9.8   Hematocrit 36.0 - 46.0 % 33.2  39.2  29.9   Platelets 150 - 400 K/uL 163  197  245      Edinburgh Score:    12/29/2023    8:28 PM  Edinburgh Postnatal Depression Scale Screening Tool  I have been able to laugh and see the funny side of things. --      After visit meds:  Allergies as of 12/30/2023   No Known Allergies      Medication List     STOP taking these medications    aspirin EC 81 MG tablet   coconut oil Oil   iron polysaccharides 150 MG capsule Commonly known as: Ferrex 150   magnesium oxide 400 (240 Mg) MG tablet Commonly known as: MAG-OX   NIFEdipine 30 MG 24 hr  tablet Commonly known as: ADALAT CC       TAKE these medications    acetaminophen 500 MG tablet Commonly known as: TYLENOL Take 2 tablets (1,000 mg total) by mouth every 6 (six) hours. What changed: when to take this   furosemide 20 MG tablet Commonly known as: LASIX Take 0.5 tablets (10 mg total) by mouth daily. What changed: how much to take   ibuprofen 600 MG tablet Commonly known as: ADVIL Take 1 tablet (600 mg total) by mouth every 6 (six) hours.   prenatal multivitamin Tabs tablet Take 1 tablet by mouth daily at 12 noon. What changed: when to take this   senna-docusate 8.6-50 MG tablet Commonly known as: Senokot-S Take 2 tablets by mouth daily.               Discharge Care Instructions  (From admission, onward)            Start     Ordered   12/30/23 0000  Change dressing (specify)       Comments: Remove honeycomb dressing on 01/02/24   12/30/23 1008             Discharge home in stable condition Infant Feeding: Breast Infant Disposition:home with mother Discharge instruction: per After Visit Summary and Postpartum booklet. Activity: Advance as tolerated. Pelvic rest for 6 weeks.  Diet: routine diet Anticipated Birth Control: Unsure Postpartum Appointment:6 wks  Additional Postpartum F/U: BP check 1 week Future Appointments:No future appointments. Follow up Visit:  Follow-up Information     Olivia Mackie, MD Follow up on 01/04/2024.   Specialty: Obstetrics and Gynecology Contact information: [address] [place] [address] [phone]                     12/30/2023 Robley Fries, MD

## 2024-01-08 ENCOUNTER — Telehealth (HOSPITAL_COMMUNITY): Payer: Self-pay | Admitting: *Deleted

## 2024-01-08 NOTE — Telephone Encounter (Signed)
 01/08/2024  Name: ITA FRITZSCHE MRN: [id number] DOB: 09-29-1986  Reason for Call:  Transition of Care Hospital Discharge Call  Contact Status: Patient Contact Status: Message  Language assistant needed:          Follow-Up Questions:    Inocente Salles Postnatal Depression Scale:  In the Past 7 Days:    PHQ2-9 Depression Scale:     Discharge Follow-up:    Post-discharge interventions: NA  Salena Saner, RN 01/08/2024 11:34
